# Patient Record
Sex: Female | Born: 1965 | Race: White | Hispanic: No | Smoking: Never smoker
Health system: Southern US, Community
[De-identification: ages and names within clinical notes are randomized; demographics above are authoritative.]

## PROBLEM LIST (undated history)

## (undated) DIAGNOSIS — K219 Gastro-esophageal reflux disease without esophagitis: Secondary | ICD-10-CM

## (undated) DIAGNOSIS — F419 Anxiety disorder, unspecified: Secondary | ICD-10-CM

## (undated) DIAGNOSIS — M549 Dorsalgia, unspecified: Secondary | ICD-10-CM

## (undated) DIAGNOSIS — F32A Depression, unspecified: Secondary | ICD-10-CM

## (undated) DIAGNOSIS — I10 Essential (primary) hypertension: Secondary | ICD-10-CM

## (undated) DIAGNOSIS — M797 Fibromyalgia: Secondary | ICD-10-CM

## (undated) DIAGNOSIS — G8929 Other chronic pain: Secondary | ICD-10-CM

## (undated) DIAGNOSIS — G2581 Restless legs syndrome: Secondary | ICD-10-CM

## (undated) DIAGNOSIS — F329 Major depressive disorder, single episode, unspecified: Secondary | ICD-10-CM

## (undated) HISTORY — PX: TUBAL LIGATION: SHX77

## (undated) HISTORY — PX: OTHER SURGICAL HISTORY: SHX169

---

## 2006-11-25 ENCOUNTER — Emergency Department (HOSPITAL_COMMUNITY): Admission: EM | Admit: 2006-11-25 | Discharge: 2006-11-25 | Payer: Self-pay | Admitting: Emergency Medicine

## 2006-11-30 ENCOUNTER — Ambulatory Visit (HOSPITAL_COMMUNITY): Admission: RE | Admit: 2006-11-30 | Discharge: 2006-11-30 | Payer: Self-pay | Admitting: Orthopaedic Surgery

## 2006-12-07 ENCOUNTER — Encounter (HOSPITAL_COMMUNITY): Admission: RE | Admit: 2006-12-07 | Discharge: 2007-01-06 | Payer: Self-pay | Admitting: Orthopaedic Surgery

## 2007-04-29 ENCOUNTER — Encounter (HOSPITAL_COMMUNITY): Admission: RE | Admit: 2007-04-29 | Discharge: 2007-05-04 | Payer: Self-pay | Admitting: Orthopaedic Surgery

## 2007-05-05 ENCOUNTER — Encounter (HOSPITAL_COMMUNITY): Admission: RE | Admit: 2007-05-05 | Discharge: 2007-06-04 | Payer: Self-pay | Admitting: Orthopaedic Surgery

## 2007-06-07 ENCOUNTER — Ambulatory Visit: Admission: RE | Admit: 2007-06-07 | Discharge: 2007-06-07 | Payer: Self-pay | Admitting: Orthopaedic Surgery

## 2007-11-23 ENCOUNTER — Encounter: Admission: RE | Admit: 2007-11-23 | Discharge: 2007-12-29 | Payer: Self-pay | Admitting: Orthopaedic Surgery

## 2008-03-27 ENCOUNTER — Emergency Department (HOSPITAL_COMMUNITY): Admission: EM | Admit: 2008-03-27 | Discharge: 2008-03-27 | Payer: Self-pay | Admitting: Emergency Medicine

## 2008-03-31 ENCOUNTER — Ambulatory Visit (HOSPITAL_COMMUNITY): Admission: RE | Admit: 2008-03-31 | Discharge: 2008-03-31 | Payer: Self-pay | Admitting: Obstetrics and Gynecology

## 2008-04-04 ENCOUNTER — Other Ambulatory Visit: Admission: RE | Admit: 2008-04-04 | Discharge: 2008-04-04 | Payer: Self-pay | Admitting: Unknown Physician Specialty

## 2008-04-04 ENCOUNTER — Encounter (INDEPENDENT_AMBULATORY_CARE_PROVIDER_SITE_OTHER): Payer: Self-pay | Admitting: Unknown Physician Specialty

## 2008-08-16 ENCOUNTER — Encounter: Payer: Self-pay | Admitting: Obstetrics & Gynecology

## 2008-08-16 ENCOUNTER — Ambulatory Visit (HOSPITAL_COMMUNITY): Admission: RE | Admit: 2008-08-16 | Discharge: 2008-08-16 | Payer: Self-pay | Admitting: Obstetrics & Gynecology

## 2009-01-16 ENCOUNTER — Encounter (HOSPITAL_COMMUNITY): Admission: RE | Admit: 2009-01-16 | Discharge: 2009-02-15 | Payer: Self-pay | Admitting: Orthopaedic Surgery

## 2009-02-06 ENCOUNTER — Ambulatory Visit (HOSPITAL_COMMUNITY): Admission: RE | Admit: 2009-02-06 | Discharge: 2009-02-06 | Payer: Self-pay | Admitting: Neurology

## 2009-04-17 ENCOUNTER — Encounter: Admission: RE | Admit: 2009-04-17 | Discharge: 2009-04-17 | Payer: Self-pay | Admitting: Gastroenterology

## 2009-09-11 ENCOUNTER — Other Ambulatory Visit: Admission: RE | Admit: 2009-09-11 | Discharge: 2009-09-11 | Payer: Self-pay | Admitting: Obstetrics & Gynecology

## 2009-11-09 ENCOUNTER — Ambulatory Visit (HOSPITAL_COMMUNITY)
Admission: RE | Admit: 2009-11-09 | Discharge: 2009-11-09 | Payer: Self-pay | Admitting: Physical Medicine and Rehabilitation

## 2009-12-18 ENCOUNTER — Ambulatory Visit (HOSPITAL_COMMUNITY): Admission: RE | Admit: 2009-12-18 | Discharge: 2009-12-18 | Payer: Self-pay | Admitting: Neurology

## 2010-01-11 ENCOUNTER — Ambulatory Visit (HOSPITAL_COMMUNITY): Admission: RE | Admit: 2010-01-11 | Discharge: 2010-01-11 | Payer: Self-pay | Admitting: Neurology

## 2010-09-17 ENCOUNTER — Other Ambulatory Visit: Payer: Self-pay | Admitting: Obstetrics & Gynecology

## 2010-09-17 ENCOUNTER — Other Ambulatory Visit (HOSPITAL_COMMUNITY)
Admission: RE | Admit: 2010-09-17 | Discharge: 2010-09-17 | Disposition: A | Discharge status: Home / Self Care | Payer: Medicare Other | Source: Ambulatory Visit | Attending: Obstetrics & Gynecology | Admitting: Obstetrics & Gynecology

## 2010-09-17 DIAGNOSIS — Z01419 Encounter for gynecological examination (general) (routine) without abnormal findings: Secondary | ICD-10-CM | POA: Insufficient documentation

## 2010-10-23 LAB — CREATININE, SERUM
Creatinine, Ser: 0.62 mg/dL (ref 0.4–1.2)
GFR calc non Af Amer: >60 mL/min (ref >60)

## 2010-11-18 LAB — CBC
HCT: 43.5 % (ref 36.0–46.0)
Platelets: 283 10*3/uL (ref 150–400)
RBC: 4.61 MIL/uL (ref 3.87–5.11)
RDW: 13.8 % (ref 11.5–15.5)
WBC: 7.7 10*3/uL (ref 4.0–10.5)

## 2010-11-18 LAB — COMPREHENSIVE METABOLIC PANEL
AST: 18 U/L (ref 0–37)
Alkaline Phosphatase: 73 U/L (ref 39–117)
CO2: 26 mEq/L (ref 19–32)
Creatinine, Ser: 0.63 mg/dL (ref 0.4–1.2)
Glucose, Bld: 90 mg/dL (ref 70–99)
Total Bilirubin: 0.8 mg/dL (ref 0.3–1.2)
Total Protein: 6.6 g/dL (ref 6.0–8.3)

## 2010-11-18 LAB — URINALYSIS, ROUTINE W REFLEX MICROSCOPIC
Protein, ur: NEGATIVE mg/dL
pH: 7.5 (ref 5.0–8.0)

## 2010-11-18 LAB — HCG, QUANTITATIVE, PREGNANCY: hCG, Beta Chain, Quant, S: <2 m[IU]/mL (ref <5)

## 2010-12-17 NOTE — Op Note (Signed)
Ruth Rose, Ruth Rose                  ACCOUNT NO.:  [id number]   MEDICAL RECORD NO.:  [id number]          PATIENT TYPE:  AMB   LOCATION:  DAY                           FACILITY:  [facility]   PHYSICIAN:  Lazaro Arms, M.D.   DATE OF BIRTH:  [birth year]   DATE OF PROCEDURE:  08/16/2008  DATE OF DISCHARGE:                               OPERATIVE REPORT   PREOPERATIVE DIAGNOSES:  1. Menometrorrhagia.  2. Dysmenorrhea.   POSTOPERATIVE DIAGNOSES:  1. Menometrorrhagia.  2. Dysmenorrhea.   PROCEDURES:  1. Hysteroscopy.  2. Dilation and curettage.  3. Endometrial ablation.   SURGEON:  Lazaro Arms, MD   ANESTHESIA:  Laryngeal mask airway, general.   FINDINGS:  The patient had normal endometrial cavity, no polyps, no  fibroids, no unusual-appearing endometrium.   DESCRIPTION OF OPERATION:  The patient was taken to the operating room,  placed in supine position, underwent laryngeal mask airway.  Placed in  lithotomy position, prepped and draped in usual sterile fashion.  Graves  speculum was placed.  Cervix was grasped with a single-tooth tenaculum.  The cervix was dilated serially to allow passage of the hysteroscope.  Hysteroscopy was performed and the above-noted findings were seen.  ThermaChoice III endometrial ablation balloon was used, 57 mL of D5W was  required to maintain a pressure greater than 150 mmHg throughout the  procedure.  She had a very spongy uterus, was not that large of cavity,  but the uterus had a lot of give to it, but it also required 3 heating  cycles, so total therapy time was 19 minutes and 50 seconds.  The  patient tolerated the procedure well.  All of the fluid was recovered at  the end of the procedure.  All the equipment worked properly.  She did  have bleeding in the area of single-tooth tenaculum site and single  figure-  of-eight suture was placed there with good hemostasis.  The patient was  awakened from anesthesia, taken to recovery room in good and  stable  condition.  All counts were correct.  She did undergo endometrial  curettage as well, and those curettings were sent to pathology for  evaluation.      Lazaro Arms, M.D.  Electronically Signed     LHE/MEDQ  D:  08/16/2008  T:  08/17/2008  Job:  [id number]

## 2011-09-22 ENCOUNTER — Other Ambulatory Visit: Payer: Self-pay | Admitting: Obstetrics & Gynecology

## 2011-09-22 ENCOUNTER — Other Ambulatory Visit (HOSPITAL_COMMUNITY)
Admission: RE | Admit: 2011-09-22 | Discharge: 2011-09-22 | Disposition: A | Discharge status: Home / Self Care | Payer: Medicare Other | Source: Ambulatory Visit | Attending: Obstetrics & Gynecology | Admitting: Obstetrics & Gynecology

## 2011-09-22 DIAGNOSIS — Z124 Encounter for screening for malignant neoplasm of cervix: Secondary | ICD-10-CM | POA: Insufficient documentation

## 2012-01-13 ENCOUNTER — Other Ambulatory Visit: Payer: Self-pay | Admitting: Medical

## 2012-03-19 ENCOUNTER — Other Ambulatory Visit: Payer: Self-pay | Admitting: Medical

## 2012-03-27 ENCOUNTER — Encounter (HOSPITAL_COMMUNITY): Payer: Self-pay

## 2012-03-27 ENCOUNTER — Emergency Department (HOSPITAL_COMMUNITY)
Admission: EM | Admit: 2012-03-27 | Discharge: 2012-03-27 | Disposition: A | Discharge status: Home / Self Care | Payer: Medicare Other | Attending: Emergency Medicine | Admitting: Emergency Medicine

## 2012-03-27 DIAGNOSIS — G2581 Restless legs syndrome: Secondary | ICD-10-CM | POA: Insufficient documentation

## 2012-03-27 DIAGNOSIS — I10 Essential (primary) hypertension: Secondary | ICD-10-CM | POA: Insufficient documentation

## 2012-03-27 DIAGNOSIS — F329 Major depressive disorder, single episode, unspecified: Secondary | ICD-10-CM | POA: Insufficient documentation

## 2012-03-27 DIAGNOSIS — S39012A Strain of muscle, fascia and tendon of lower back, initial encounter: Secondary | ICD-10-CM

## 2012-03-27 DIAGNOSIS — F3289 Other specified depressive episodes: Secondary | ICD-10-CM | POA: Insufficient documentation

## 2012-03-27 DIAGNOSIS — X500XXA Overexertion from strenuous movement or load, initial encounter: Secondary | ICD-10-CM | POA: Insufficient documentation

## 2012-03-27 DIAGNOSIS — F411 Generalized anxiety disorder: Secondary | ICD-10-CM | POA: Insufficient documentation

## 2012-03-27 DIAGNOSIS — K219 Gastro-esophageal reflux disease without esophagitis: Secondary | ICD-10-CM | POA: Insufficient documentation

## 2012-03-27 DIAGNOSIS — S335XXA Sprain of ligaments of lumbar spine, initial encounter: Secondary | ICD-10-CM | POA: Insufficient documentation

## 2012-03-27 HISTORY — DX: Major depressive disorder, single episode, unspecified: F32.9

## 2012-03-27 HISTORY — DX: Depression, unspecified: F32.A

## 2012-03-27 HISTORY — DX: Essential (primary) hypertension: I10

## 2012-03-27 HISTORY — DX: Dorsalgia, unspecified: M54.9

## 2012-03-27 HISTORY — DX: Gastro-esophageal reflux disease without esophagitis: K21.9

## 2012-03-27 HISTORY — DX: Anxiety disorder, unspecified: F41.9

## 2012-03-27 HISTORY — DX: Other chronic pain: G89.29

## 2012-03-27 HISTORY — DX: Restless legs syndrome: G25.81

## 2012-03-27 MED ORDER — CYCLOBENZAPRINE HCL 10 MG PO TABS
10.0000 mg | ORAL_TABLET | Freq: Once | ORAL | Status: AC
  Administered 2012-03-27: 10 mg via ORAL
  Filled 2012-03-27: qty 1

## 2012-03-27 MED ORDER — ONDANSETRON 4 MG PO TBDP
4.0000 mg | ORAL_TABLET | Freq: Once | ORAL | Status: AC
  Administered 2012-03-27: 4 mg via ORAL
  Filled 2012-03-27: qty 1

## 2012-03-27 MED ORDER — PREDNISONE 50 MG PO TABS: ORAL_TABLET | ORAL | Status: AC

## 2012-03-27 MED ORDER — PREDNISONE 20 MG PO TABS
60.0000 mg | ORAL_TABLET | Freq: Once | ORAL | Status: AC
  Administered 2012-03-27: 60 mg via ORAL
  Filled 2012-03-27: qty 3

## 2012-03-27 MED ORDER — HYDROMORPHONE HCL PF 1 MG/ML IJ SOLN
1.0000 mg | Freq: Once | INTRAMUSCULAR | Status: AC
  Administered 2012-03-27: 1 mg via INTRAMUSCULAR
  Filled 2012-03-27: qty 1

## 2012-03-27 NOTE — ED Notes (Signed)
Pt reports a leg cramp that ran up her leg this morning and now she is having back pain, pt denies any recent injury, has h/o chronic back pain

## 2012-03-27 NOTE — ED Notes (Signed)
Pt states awoke this morning with left leg pain/cramps, after rolling over in bed. Now describes pain in lower back. Pt has H/O of chronic back pain. Reports radiofrequency nerve block completed at pain management clinic.  Pt sitting in wheel chair, NAD noted.

## 2012-03-27 NOTE — ED Provider Notes (Signed)
History     CSN: [id number]  Arrival date & time 03/27/12  1336   None     Chief Complaint  Patient presents with  . Back Pain    (Consider location/radiation/quality/duration/timing/severity/associated sxs/prior treatment) HPI Comments: Pt had a cramp in her R calf this AM.  She twisted to get out of bed this AM and began having L lower lumbar pain.  She has chronic back pain and is seen by dr. Gerilyn Pilgrim for pain management.    Has taken her hydrocodone with no relief.  Patient is a 46 y.o. female presenting with back pain. The history is provided by the patient. No language interpreter was used.  Back Pain  This is a chronic problem. Episode onset: this AM. The problem occurs constantly. The problem has not changed since onset.The pain is associated with twisting. The pain is present in the lumbar spine. The pain does not radiate. The pain is severe. The symptoms are aggravated by bending and twisting. Pertinent negatives include no numbness, no bowel incontinence, no perianal numbness, no bladder incontinence and no weakness. She has tried analgesics for the symptoms.    Past Medical History  Diagnosis Date  . Chronic back pain   . Hypertension   . Depression   . Anxiety   . Acid reflux   . Restless leg syndrome     Past Surgical History  Procedure Date  . Tubal ligation   . Ablasion     No family history on file.  History  Substance Use Topics  . Smoking status: Never Smoker   . Smokeless tobacco: Not on file  . Alcohol Use: No    OB History    Grav Para Term Preterm Abortions TAB SAB Ect Mult Living                  Review of Systems  Gastrointestinal: Negative for bowel incontinence.  Genitourinary: Negative for bladder incontinence.  Musculoskeletal: Positive for back pain.  Neurological: Negative for weakness and numbness.  All other systems reviewed and are negative.    Allergies  Review of patient's allergies indicates no known  allergies.  Home Medications   Current Outpatient Rx  Name Route Sig Dispense Refill  . PREDNISONE 50 MG PO TABS  One tab po QD 5 tablet 0    BP 117/73  Pulse 86  Temp 98 F (36.7 C) (Oral)  Resp 20  Ht 5\' 2"  (1.575 m)  Wt 118 lb (53.524 kg)  BMI 21.58 kg/m2  SpO2 100%  Physical Exam  Nursing note and vitals reviewed. Constitutional: She is oriented to person, place, and time. She appears well-developed and well-nourished. No distress.  HENT:  Head: Normocephalic and atraumatic.  Eyes: EOM are normal.  Neck: Normal range of motion.  Cardiovascular: Normal rate, regular rhythm and normal heart sounds.   Pulmonary/Chest: Effort normal and breath sounds normal.  Abdominal: Soft. She exhibits no distension. There is no tenderness.  Musculoskeletal: She exhibits tenderness.       Lumbar back: She exhibits decreased range of motion, tenderness and pain. She exhibits no bony tenderness, no swelling, no edema, no deformity, no laceration, no spasm and normal pulse.       Back:  Neurological: She is alert and oriented to person, place, and time.  Skin: Skin is warm and dry.  Psychiatric: She has a normal mood and affect. Judgment normal.    ED Course  Procedures (including critical care time)  Labs Reviewed - No  data to display No results found.   1. Lumbar strain       MDM  Has hydrocodone rx-prednisone 50 mg, 6 Ice F/u with dr. Gerilyn Pilgrim.        Evalina Field, [place] 03/27/12 1512

## 2012-03-27 NOTE — ED Provider Notes (Signed)
Medical screening examination/treatment/procedure(s) were performed by non-physician practitioner and as supervising physician I was immediately available for consultation/collaboration.  Hurman Horn, MD 03/27/12 Ebony Cargo

## 2012-04-07 ENCOUNTER — Other Ambulatory Visit: Payer: Self-pay | Admitting: Medical

## 2012-05-04 ENCOUNTER — Emergency Department (HOSPITAL_COMMUNITY): Payer: Medicare Other

## 2012-05-04 ENCOUNTER — Encounter (HOSPITAL_COMMUNITY): Payer: Self-pay

## 2012-05-04 ENCOUNTER — Emergency Department (HOSPITAL_COMMUNITY)
Admission: EM | Admit: 2012-05-04 | Discharge: 2012-05-04 | Disposition: A | Discharge status: Home / Self Care | Payer: Medicare Other | Attending: Emergency Medicine | Admitting: Emergency Medicine

## 2012-05-04 DIAGNOSIS — G8929 Other chronic pain: Secondary | ICD-10-CM | POA: Insufficient documentation

## 2012-05-04 DIAGNOSIS — G2581 Restless legs syndrome: Secondary | ICD-10-CM | POA: Insufficient documentation

## 2012-05-04 DIAGNOSIS — R079 Chest pain, unspecified: Secondary | ICD-10-CM

## 2012-05-04 DIAGNOSIS — F411 Generalized anxiety disorder: Secondary | ICD-10-CM | POA: Insufficient documentation

## 2012-05-04 DIAGNOSIS — R071 Chest pain on breathing: Secondary | ICD-10-CM | POA: Insufficient documentation

## 2012-05-04 DIAGNOSIS — I10 Essential (primary) hypertension: Secondary | ICD-10-CM | POA: Insufficient documentation

## 2012-05-04 LAB — BASIC METABOLIC PANEL
Calcium: 8.8 mg/dL (ref 8.4–10.5)
Creatinine, Ser: 0.86 mg/dL (ref 0.50–1.10)
GFR calc Af Amer: >90 mL/min (ref >90)

## 2012-05-04 LAB — CBC WITH DIFFERENTIAL/PLATELET
Eosinophils Absolute: 0.1 10*3/uL (ref 0.0–0.7)
HCT: 38.9 % (ref 36.0–46.0)
MCHC: 32.6 g/dL (ref 30.0–36.0)
MCV: 97 fL (ref 78.0–100.0)
Monocytes Relative: 9 % (ref 3–12)
Neutro Abs: 7.4 10*3/uL (ref 1.7–7.7)
RDW: 13.7 % (ref 11.5–15.5)

## 2012-05-04 MED ORDER — KETOROLAC TROMETHAMINE 60 MG/2ML IM SOLN
60.0000 mg | Freq: Once | INTRAMUSCULAR | Status: AC
Start: 2012-05-04 — End: 2012-05-04
  Administered 2012-05-04: 60 mg via INTRAMUSCULAR
  Filled 2012-05-04: qty 2

## 2012-05-04 MED ORDER — CYCLOBENZAPRINE HCL 10 MG PO TABS: 10.0000 mg | ORAL_TABLET | Freq: Two times a day (BID) | ORAL | Status: DC | PRN

## 2012-05-04 MED ORDER — OXYCODONE-ACETAMINOPHEN 5-325 MG PO TABS: 1.0000 | ORAL_TABLET | Freq: Four times a day (QID) | ORAL | Status: DC | PRN

## 2012-05-04 NOTE — ED Provider Notes (Signed)
History  This chart was scribed for Donnetta Hutching, MD by Ladona Ridgel Day. This patient was seen in room [facility] and the patient's care was started at 1204.   CSN: [id number]  Arrival date & time 05/04/12  1204   First MD Initiated Contact with Patient 05/04/12 1304      Chief Complaint  Patient presents with  . Chest Pain   Patient is a 46 y.o. female presenting with chest pain. The history is provided by the patient. No language interpreter was used.  Chest Pain The chest pain began 2 days ago. Duration of episode(s) is 15 minutes. Chest pain occurs intermittently. The chest pain is unchanged. The pain is associated with lifting. The severity of the pain is moderate. The quality of the pain is described as sharp. The pain does not radiate. Chest pain is worsened by certain positions. Pertinent negatives for primary symptoms include no fever, no shortness of breath, no nausea and no vomiting.  Pertinent negatives for associated symptoms include no weakness. She tried nothing for the symptoms.    Ruth Rose is a 46 y.o. female who presents to the Emergency Department complaining of intermittent chest tightness for the past 3-4 days worse with left shoulder extension and lasts 15-20 minutse at a time for the past 2 days. She denies any fever, emesis, diarrhea, cough/congestion.   Her PCP is Dr. Cyndee Brightly in [facility]  Past Medical History  Diagnosis Date  . Chronic back pain   . Hypertension   . Depression   . Anxiety   . Acid reflux   . Restless leg syndrome     Past Surgical History  Procedure Date  . Tubal ligation   . Ablasion     No family history on file.  History  Substance Use Topics  . Smoking status: Never Smoker   . Smokeless tobacco: Not on file  . Alcohol Use: No    OB History    Grav Para Term Preterm Abortions TAB SAB Ect Mult Living                  Review of Systems  Constitutional: Negative for fever and chills.  Respiratory: Negative for shortness of  breath.   Cardiovascular: Positive for chest pain.  Gastrointestinal: Negative for nausea and vomiting.  Musculoskeletal:       Pain with ROM left shoulder  Neurological: Negative for weakness.  All other systems reviewed and are negative.    Allergies  Review of patient's allergies indicates no known allergies.  Home Medications   Current Outpatient Rx  Name Route Sig Dispense Refill  . ALPRAZOLAM 1 MG PO TABS Oral Take 1 mg by mouth 3 (three) times daily.    Marland Kitchen DICLOFENAC EPOLAMINE 1.3 % TD PTCH Transdermal Place 1 patch onto the skin daily.    Marland Kitchen DICLOFENAC SODIUM 75 MG PO TBEC Oral Take 75 mg by mouth 2 (two) times daily.    Marland Kitchen ESOMEPRAZOLE MAGNESIUM 40 MG PO CPDR Oral Take 40 mg by mouth daily before breakfast.    . FLUTICASONE PROPIONATE 50 MCG/ACT NA SUSP Nasal Place 2 sprays into the nose daily.    Marland Kitchen GABAPENTIN (PHN) 600 MG PO TABS Oral Take 600 mg by mouth 3 (three) times daily.    Marland Kitchen ONDANSETRON HCL 4 MG PO TABS Oral Take 4 mg by mouth every 8 (eight) hours as needed. Nausea and Vomiting    . ROPINIROLE HCL 0.25 MG PO TABS Oral Take 0.25 mg by mouth  daily.    . SUMATRIPTAN SUCCINATE 100 MG PO TABS Oral Take 100 mg by mouth every 2 (two) hours as needed. Migraine    . TOPIRAMATE 50 MG PO TABS Oral Take 50 mg by mouth daily.      Triage vitals: BP 117/79  Pulse 87  Temp 98.3 F (36.8 C)  Resp 20  Ht 5\' 2"  (1.575 m)  Wt 140 lb (63.504 kg)  BMI 25.61 kg/m2  SpO2 100%  Physical Exam  Nursing note and vitals reviewed. Constitutional: She is oriented to person, place, and time. She appears well-developed and well-nourished.  HENT:  Head: Normocephalic and atraumatic.  Eyes: Conjunctivae normal and EOM are normal. Pupils are equal, round, and reactive to light.  Neck: Normal range of motion. Neck supple.  Cardiovascular: Normal rate, regular rhythm and normal heart sounds.   Pulmonary/Chest: Effort normal and breath sounds normal. No respiratory distress. She has no  wheezes. She has no rales. She exhibits tenderness (Left anterior chest wall tenderness          ).  Abdominal: Soft. Bowel sounds are normal.  Musculoskeletal: Normal range of motion.  Neurological: She is alert and oriented to person, place, and time.  Skin: Skin is warm and dry.  Psychiatric: She has a normal mood and affect.    ED Course  Procedures (including critical care time) DIAGNOSTIC STUDIES: Oxygen Saturation is 100% on room air, normal by my interpretation.    COORDINATION OF CARE: At 145 PM Discussed treatment plan with patient which includes blood work, heart markers, D-dimer, CXR. Patient agrees.   Labs Reviewed  CBC WITH DIFFERENTIAL - Abnormal; Notable for the following:    WBC 11.8 (*)     Monocytes Absolute 1.1 (*)     All other components within normal limits  BASIC METABOLIC PANEL - Abnormal; Notable for the following:    GFR calc non Af Amer 80 (*)     All other components within normal limits  D-DIMER, QUANTITATIVE - Abnormal; Notable for the following:    D-Dimer, Quant 1.65 (*)     All other components within normal limits  TROPONIN I   Dg Chest 2 View  05/04/2012  *RADIOLOGY REPORT*  Clinical Data: Chest pain  CHEST - 2 VIEW  Comparison: None.  Findings: Lungs clear.  Heart size and pulmonary vascularity are normal.  No adenopathy.  There is lower thoracic dextroscoliosis with lumbar levoscoliosis.  IMPRESSION: No edema or consolidation.   Original Report Authenticated By: Arvin Collard. WOODRUFF III, M.D.      No diagnosis found.   Date: 05/04/2012  Rate: 90  Rhythm: normal sinus rhythm  QRS Axis: normal  Intervals: normal  ST/T Wave abnormalities: normal  Conduction Disutrbances: none  Narrative Interpretation: unremarkable     MDM   Patient low risk for acute coronary syndrome or pulmonary embolus. Pain is clearly worse with movement of the arm. She is also tender in the chest wall. D-dimer which was not ordered by me was elevated. This is  noted, but again her risk factor profile for PE is extremely low. Normal vital signs. Normal oxygenation.discharge home on Percocet and Flexeril     I personally performed the services described in this documentation, which was scribed in my presence. The recorded information has been reviewed and considered.          Donnetta Hutching, MD 05/04/12 [phone]

## 2012-05-04 NOTE — ED Notes (Signed)
Pt reports left side/mid sternal cp x2 days pain comes and goes, sob at times, denies any n/v/d or fever. Denies any cough or congestion. Pain worse with left arm movement.

## 2012-05-04 NOTE — ED Notes (Signed)
Pt with left chest pain x 2-3 days per pt, today with SOB, no distress noted at this time, denies N/V or diaphoresis

## 2012-06-28 ENCOUNTER — Ambulatory Visit (HOSPITAL_COMMUNITY)
Admission: RE | Admit: 2012-06-28 | Discharge: 2012-06-28 | Disposition: A | Discharge status: Home / Self Care | Payer: Medicare Other | Source: Ambulatory Visit | Attending: Nurse Practitioner | Admitting: Nurse Practitioner

## 2012-06-28 ENCOUNTER — Other Ambulatory Visit (HOSPITAL_COMMUNITY): Payer: Self-pay | Admitting: Nurse Practitioner

## 2012-06-28 DIAGNOSIS — M545 Low back pain, unspecified: Secondary | ICD-10-CM | POA: Insufficient documentation

## 2012-06-28 DIAGNOSIS — M4120 Other idiopathic scoliosis, site unspecified: Secondary | ICD-10-CM | POA: Insufficient documentation

## 2012-08-06 ENCOUNTER — Emergency Department (HOSPITAL_COMMUNITY)
Admission: EM | Admit: 2012-08-06 | Discharge: 2012-08-06 | Disposition: A | Discharge status: Home / Self Care | Payer: Medicare Other | Attending: Emergency Medicine | Admitting: Emergency Medicine

## 2012-08-06 ENCOUNTER — Encounter (HOSPITAL_COMMUNITY): Payer: Self-pay | Admitting: *Deleted

## 2012-08-06 ENCOUNTER — Emergency Department (HOSPITAL_COMMUNITY): Payer: Medicare Other

## 2012-08-06 DIAGNOSIS — S60229A Contusion of unspecified hand, initial encounter: Secondary | ICD-10-CM | POA: Insufficient documentation

## 2012-08-06 DIAGNOSIS — Z8719 Personal history of other diseases of the digestive system: Secondary | ICD-10-CM | POA: Insufficient documentation

## 2012-08-06 DIAGNOSIS — M549 Dorsalgia, unspecified: Secondary | ICD-10-CM | POA: Insufficient documentation

## 2012-08-06 DIAGNOSIS — Z79899 Other long term (current) drug therapy: Secondary | ICD-10-CM | POA: Insufficient documentation

## 2012-08-06 DIAGNOSIS — Z8739 Personal history of other diseases of the musculoskeletal system and connective tissue: Secondary | ICD-10-CM | POA: Insufficient documentation

## 2012-08-06 DIAGNOSIS — G8929 Other chronic pain: Secondary | ICD-10-CM | POA: Insufficient documentation

## 2012-08-06 DIAGNOSIS — I10 Essential (primary) hypertension: Secondary | ICD-10-CM | POA: Insufficient documentation

## 2012-08-06 DIAGNOSIS — F3289 Other specified depressive episodes: Secondary | ICD-10-CM | POA: Insufficient documentation

## 2012-08-06 DIAGNOSIS — F329 Major depressive disorder, single episode, unspecified: Secondary | ICD-10-CM | POA: Insufficient documentation

## 2012-08-06 DIAGNOSIS — X58XXXA Exposure to other specified factors, initial encounter: Secondary | ICD-10-CM | POA: Insufficient documentation

## 2012-08-06 DIAGNOSIS — Y929 Unspecified place or not applicable: Secondary | ICD-10-CM | POA: Insufficient documentation

## 2012-08-06 DIAGNOSIS — F411 Generalized anxiety disorder: Secondary | ICD-10-CM | POA: Insufficient documentation

## 2012-08-06 DIAGNOSIS — Y9389 Activity, other specified: Secondary | ICD-10-CM | POA: Insufficient documentation

## 2012-08-06 MED ORDER — IBUPROFEN 800 MG PO TABS
800.0000 mg | ORAL_TABLET | Freq: Once | ORAL | Status: AC
  Administered 2012-08-06: 800 mg via ORAL
  Filled 2012-08-06: qty 1

## 2012-08-06 MED ORDER — IBUPROFEN 800 MG PO TABS: 800.0000 mg | ORAL_TABLET | Freq: Three times a day (TID) | ORAL | Status: DC

## 2012-08-06 NOTE — ED Notes (Signed)
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

## 2012-08-06 NOTE — ED Notes (Signed)
Pt c/o left hand pain since trying to open car door tonight.

## 2012-08-06 NOTE — ED Provider Notes (Signed)
History   This chart was scribed for Ruth Octave, MD by Ruth Rose, ED Scribe. This patient was seen in room [facility]03/[facility]03 and the patient's care was started at 9:41 PM    CSN: [id number]  Arrival date & time 08/06/12  2123   First MD Initiated Contact with Patient 08/06/12 2138      Chief Complaint  Patient presents with  . Hand Pain     The history is provided by the patient. No language interpreter was used.   Ruth Rose is a 47 y.o. female with h/o chronic back pain, HTN, depression, and anxiety who presents to the Emergency Department complaining of constant, mild, gradually worsening, non-radiating left hand pain with sudden onset after trying to yank car door open earlier today.  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  Pt denies pain in left wrist and elbow, and has no further physical complaints.  Pt denies tobacco and alcohol use.   Past Medical History  Diagnosis Date  . Chronic back pain   . Hypertension   . Depression   . Anxiety   . Acid reflux   . Restless leg syndrome     Past Surgical History  Procedure Date  . Tubal ligation   . Ablasion     History reviewed. No pertinent family history.  History  Substance Use Topics  . Smoking status: Never Smoker   . Smokeless tobacco: Not on file  . Alcohol Use: No    No OB history provided.   Review of Systems A complete 10 system review of systems was obtained and all systems are negative except as noted in the HPI and PMH.   Allergies  Review of patient's allergies indicates no known allergies.  Home Medications   Current Outpatient Rx  Name  Route  Sig  Dispense  Refill  . ALPRAZOLAM 1 MG PO TABS   Oral   Take 1 mg by mouth 3 (three) times daily.         . CYCLOBENZAPRINE HCL 10 MG PO TABS   Oral   Take 1 tablet (10 mg total) by mouth 2 (two) times daily as needed for muscle spasms.   20 tablet   0   .  DICLOFENAC EPOLAMINE 1.3 % TD PTCH   Transdermal   Place 1 patch onto the skin daily.         Marland Kitchen DICLOFENAC SODIUM 75 MG PO TBEC   Oral   Take 75 mg by mouth 2 (two) times daily.         Marland Kitchen FLUTICASONE PROPIONATE 50 MCG/ACT NA SUSP   Nasal   Place 2 sprays into the nose daily.         Marland Kitchen GABAPENTIN (PHN) 600 MG PO TABS   Oral   Take 600 mg by mouth 3 (three) times daily.         Marland Kitchen ONDANSETRON HCL 4 MG PO TABS   Oral   Take 4 mg by mouth every 8 (eight) hours as needed. Nausea and Vomiting         . ROPINIROLE HCL 0.25 MG PO TABS   Oral   Take 0.25 mg by mouth daily.         . TOPIRAMATE 50 MG PO TABS   Oral   Take 50 mg by mouth daily.         . TRAVOPROST (BAK FREE) 0.004 %  OP SOLN   Both Eyes   Place 1 drop into both eyes at bedtime.           BP 149/93  Pulse 87  Temp 97.5 F (36.4 C) (Oral)  Resp 18  Ht 5\' 2"  (1.575 m)  Wt 140 lb (63.504 kg)  BMI 25.61 kg/m2  SpO2 100%  Physical Exam  Nursing note and vitals reviewed. Constitutional: She is oriented to person, place, and time. She appears well-developed and well-nourished. No distress.  HENT:  Head: Normocephalic and atraumatic.  Eyes: Conjunctivae normal are normal.  Neck: Neck supple. No tracheal deviation present.  Cardiovascular: Normal rate, regular rhythm and normal heart sounds.   No murmur heard. Pulmonary/Chest: Effort normal and breath sounds normal. No respiratory distress. She has no wheezes.  Musculoskeletal: Normal range of motion.       Tender over left dorsal second metacarpal, +2 radial pulse, congenital deformities of fingers 2-4 with no flexor or extension activity at baseline, full ROM of thumb, no pain in left wrist and elbow.  Neurological: She is alert and oriented to person, place, and time.  Skin: Skin is warm and dry.  Psychiatric: She has a normal mood and affect. Her behavior is normal.    ED Course  Procedures (including critical care time) DIAGNOSTIC  STUDIES: Oxygen Saturation is 100% on room air, normal by my interpretation.    COORDINATION OF CARE: 9:45 PM- Patient informed of clinical course, understands medical decision-making process, and agrees with plan.  Ordered ibuprofen and left hand XR. 10:37 PM- Re-check, informed pt of negative XR.  Advised elevation and ice at home.  Discussed discharge.    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 Bones appear demineralized. Deformities of these third and fourth metacarpals. No definite acute fracture, dislocation or bone destruction.  IMPRESSION: No definite acute bony abnormalities.   Original Report Authenticated By: Ruth Rose, M.D.      No diagnosis found.    MDM  Pain to left second metacarpal after trying to open a frozen car door. Denies direct trauma denies weakness, numbness or tingling.  Congenital deformity to hand. +2 radial pulse.  No flexion or extension of fingers stubs at baseline.  Xray negative.  Supportive care with [facility].  I personally performed the services described in this documentation, which was scribed in my presence. The recorded information has been reviewed and is accurate.        Ruth Octave, MD 08/06/12 2248

## 2012-08-19 ENCOUNTER — Ambulatory Visit (HOSPITAL_COMMUNITY)
Admission: RE | Admit: 2012-08-19 | Discharge: 2012-08-19 | Disposition: A | Discharge status: Home / Self Care | Payer: Medicare Other | Source: Ambulatory Visit | Attending: Neurology | Admitting: Neurology

## 2012-08-19 DIAGNOSIS — M545 Low back pain, unspecified: Secondary | ICD-10-CM | POA: Insufficient documentation

## 2012-08-19 DIAGNOSIS — IMO0001 Reserved for inherently not codable concepts without codable children: Secondary | ICD-10-CM | POA: Insufficient documentation

## 2012-08-19 DIAGNOSIS — M6281 Muscle weakness (generalized): Secondary | ICD-10-CM | POA: Insufficient documentation
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
## 2012-08-20 DIAGNOSIS — M545 Low back pain: Secondary | ICD-10-CM | POA: Insufficient documentation

## 2012-08-23 ENCOUNTER — Ambulatory Visit (HOSPITAL_COMMUNITY)
Admission: RE | Admit: 2012-08-23 | Discharge: 2012-08-23 | Disposition: A | Discharge status: Home / Self Care | Payer: Medicare Other | Source: Ambulatory Visit | Attending: Family Medicine | Admitting: Family Medicine

## 2012-08-23 NOTE — Progress Notes (Signed)
Physical Therapy Treatment Patient Details  Name: Ruth Rose MRN: [id number] Date of Birth: [birth year]  Today's Date: 08/23/2012 Time: 9147-8295 PT Time Calculation (min): 57 min Charges: 30' manual, 10' TE, 1 estim, 1 heat  Visit#: 2  of 12   Re-eval: 09/18/12    Authorization: MEDICARE  Authorization Time Period:    Authorization Visit#: 2  of 10    Subjective: Symptoms/Limitations Symptoms: Pt reports that she was feeling a little better after the eval.  States she is very tight today.  Pain Assessment Currently in Pain?: Yes Pain Score:   7 Pain Location: Back  Exercise/Treatments Stretches Single Knee to Chest Stretch: 3 reps;30 seconds (BLE) Lower Trunk Rotation: 5 reps;10 seconds Supine Ab Set: 10 reps;Limitations AB Set Limitations: 10 sec hlds Bent Knee Raise: 5 reps (alternating BLE) Bridge: 10 reps Prone  Opposite Arm/Leg Raise: Left arm/Right leg;Right arm/Left leg;5 reps  Modalities Modalities: Electrical Stimulation;Moist Heat Manual Therapy Joint Mobilization: Grade I-II PA to SP and L T10-L3 w/STM to L errector spinae after to decrease pain.  Moist Heat Therapy Number Minutes Moist Heat: 15 Minutes Moist Heat Location: Other (comment) (back) Electrical Stimulation Electrical Stimulation Location: IFES to L thoracic and upper lumbar spine Electrical Stimulation Parameters: IFES x15 min.  Electrical Stimulation Goals: Pain  Physical Therapy Assessment and Plan PT Assessment and Plan Clinical Impression Statement: Pt with significant reduction in muscular spasms after manual treatment today.  Demonstrates greatest difficulty with LE flexibility and swelling to LUQ. Reports significant reduction in pain at end of treatment today.  PT Plan: F/U w/manual therapy and e-stim treatment and exercises.  Continue to progress core strength in supine, SL and prone postions.  Progress to standing and t-band when spasms and pain are decreased.     Goals     Problem List Patient Active Problem List  Diagnosis  . Lumbago    PT Plan of Care PT Patient Instructions: Continued to encourage patient about diaphragmatic breathing and focusing on positive aspects of her life.   Lyza Houseworth, PT 08/23/2012, 5:39 PM

## 2012-08-25 ENCOUNTER — Ambulatory Visit (HOSPITAL_COMMUNITY): Payer: Medicare Other | Admitting: Physical Therapy

## 2012-08-27 ENCOUNTER — Ambulatory Visit (HOSPITAL_COMMUNITY): Payer: Medicare Other | Admitting: Physical Therapy

## 2012-08-30 ENCOUNTER — Ambulatory Visit (HOSPITAL_COMMUNITY): Payer: Medicare Other | Admitting: Physical Therapy

## 2012-09-01 ENCOUNTER — Ambulatory Visit (HOSPITAL_COMMUNITY): Payer: Medicare Other | Admitting: Physical Therapy

## 2012-09-03 ENCOUNTER — Ambulatory Visit (HOSPITAL_COMMUNITY): Payer: Medicare Other | Admitting: Physical Therapy

## 2012-09-06 ENCOUNTER — Ambulatory Visit (HOSPITAL_COMMUNITY): Payer: Medicare Other | Admitting: Physical Therapy

## 2012-09-08 ENCOUNTER — Ambulatory Visit (HOSPITAL_COMMUNITY): Payer: Medicare Other

## 2012-09-10 ENCOUNTER — Ambulatory Visit (HOSPITAL_COMMUNITY): Payer: Medicare Other | Admitting: Physical Therapy

## 2012-09-13 ENCOUNTER — Ambulatory Visit (HOSPITAL_COMMUNITY): Payer: Medicare Other | Admitting: Physical Therapy

## 2012-09-15 ENCOUNTER — Ambulatory Visit (HOSPITAL_COMMUNITY): Payer: Medicare Other

## 2012-09-17 ENCOUNTER — Ambulatory Visit (HOSPITAL_COMMUNITY): Payer: Medicare Other | Admitting: Physical Therapy

## 2012-11-05 ENCOUNTER — Encounter (HOSPITAL_COMMUNITY): Payer: Self-pay | Admitting: *Deleted

## 2012-11-05 ENCOUNTER — Emergency Department (HOSPITAL_COMMUNITY)
Admission: EM | Admit: 2012-11-05 | Discharge: 2012-11-05 | Disposition: A | Discharge status: Home / Self Care | Payer: Medicare Other | Attending: Emergency Medicine | Admitting: Emergency Medicine

## 2012-11-05 DIAGNOSIS — IMO0001 Reserved for inherently not codable concepts without codable children: Secondary | ICD-10-CM | POA: Insufficient documentation

## 2012-11-05 DIAGNOSIS — I10 Essential (primary) hypertension: Secondary | ICD-10-CM | POA: Insufficient documentation

## 2012-11-05 DIAGNOSIS — F329 Major depressive disorder, single episode, unspecified: Secondary | ICD-10-CM | POA: Insufficient documentation

## 2012-11-05 DIAGNOSIS — G8929 Other chronic pain: Secondary | ICD-10-CM | POA: Insufficient documentation

## 2012-11-05 DIAGNOSIS — IMO0002 Reserved for concepts with insufficient information to code with codable children: Secondary | ICD-10-CM | POA: Insufficient documentation

## 2012-11-05 DIAGNOSIS — G2581 Restless legs syndrome: Secondary | ICD-10-CM | POA: Insufficient documentation

## 2012-11-05 DIAGNOSIS — F3289 Other specified depressive episodes: Secondary | ICD-10-CM | POA: Insufficient documentation

## 2012-11-05 DIAGNOSIS — Z791 Long term (current) use of non-steroidal anti-inflammatories (NSAID): Secondary | ICD-10-CM | POA: Insufficient documentation

## 2012-11-05 DIAGNOSIS — F411 Generalized anxiety disorder: Secondary | ICD-10-CM | POA: Insufficient documentation

## 2012-11-05 DIAGNOSIS — Z8739 Personal history of other diseases of the musculoskeletal system and connective tissue: Secondary | ICD-10-CM | POA: Insufficient documentation

## 2012-11-05 DIAGNOSIS — Z8719 Personal history of other diseases of the digestive system: Secondary | ICD-10-CM | POA: Insufficient documentation

## 2012-11-05 DIAGNOSIS — J029 Acute pharyngitis, unspecified: Secondary | ICD-10-CM | POA: Insufficient documentation

## 2012-11-05 DIAGNOSIS — Z79899 Other long term (current) drug therapy: Secondary | ICD-10-CM | POA: Insufficient documentation

## 2012-11-05 LAB — RAPID STREP SCREEN (MED CTR MEBANE ONLY): Streptococcus, Group A Screen (Direct): NEGATIVE

## 2012-11-05 MED ORDER — AMOXICILLIN 250 MG PO CAPS
500.0000 mg | ORAL_CAPSULE | Freq: Once | ORAL | Status: AC
  Administered 2012-11-05: 500 mg via ORAL

## 2012-11-05 MED ORDER — AMOXICILLIN 250 MG PO CAPS
ORAL_CAPSULE | ORAL | Status: AC
  Filled 2012-11-05: qty 2

## 2012-11-05 MED ORDER — AMOXICILLIN 500 MG PO CAPS: 500.0000 mg | ORAL_CAPSULE | Freq: Three times a day (TID) | ORAL | Status: DC

## 2012-11-05 NOTE — ED Provider Notes (Signed)
History    This chart was scribed for Ruth Lennert, MD by Gerlean Ren, ED Scribe. This patient was seen in room APFT23/APFT23 and the patient's care was started at 9:27 PM    CSN: [id number]  Arrival date & time 11/05/12  2108   First MD Initiated Contact with Patient 11/05/12 2126      Chief Complaint  Patient presents with  . Sore Throat     The history is provided by the patient. No language interpreter was used.  Ruth Rose is a 47 y.o. female who presents to the Emergency Department complaining of sore throat first noticed last night with associated myalgias beginning today.  Pt denies fevers, chills, nausea, emesis, diarrhea.  No sick contacts.   Past Medical History  Diagnosis Date  . Chronic back pain   . Hypertension   . Depression   . Anxiety   . Acid reflux   . Restless leg syndrome     Past Surgical History  Procedure Laterality Date  . Tubal ligation    . Ablasion      History reviewed. No pertinent family history.  History  Substance Use Topics  . Smoking status: Never Smoker   . Smokeless tobacco: Not on file  . Alcohol Use: No    No OB history provided.   Review of Systems  Constitutional: Negative for fever and chills.  HENT: Positive for sore throat. Negative for congestion, sinus pressure and ear discharge.   Eyes: Negative for discharge.  Respiratory: Negative for cough.   Cardiovascular: Negative for chest pain.  Gastrointestinal: Negative for nausea, vomiting, abdominal pain and diarrhea.  Genitourinary: Negative for frequency and hematuria.  Musculoskeletal: Positive for myalgias.  Skin: Negative for rash.  Neurological: Negative for seizures and headaches.  Psychiatric/Behavioral: Negative for hallucinations.    Allergies  Review of patient's allergies indicates no known allergies.  Home Medications   Current Outpatient Rx  Name  Route  Sig  Dispense  Refill  . ALPRAZolam (XANAX) 1 MG tablet   Oral   Take 1 mg by mouth 3  (three) times daily.         . Carboxymethylcellulose Sodium (THERATEARS) 0.25 % SOLN   Ophthalmic   Apply 1-2 drops to eye daily as needed (for dry eye relief).         . cyclobenzaprine (FLEXERIL) 10 MG tablet   Oral   Take 1 tablet (10 mg total) by mouth 2 (two) times daily as needed for muscle spasms.   20 tablet   0   . diclofenac (FLECTOR) 1.3 % PTCH   Transdermal   Place 1 patch onto the skin daily.         . fluticasone (FLONASE) 50 MCG/ACT nasal spray   Nasal   Place 2 sprays into the nose daily.         . Gabapentin, PHN, (GRALISE) 600 MG TABS   Oral   Take 600 mg by mouth 3 (three) times daily.         Marland Kitchen HYDROcodone-acetaminophen (NORCO/VICODIN) 5-325 MG per tablet   Oral   Take 1 tablet by mouth every 6 (six) hours as needed for pain.         Marland Kitchen ibuprofen (ADVIL,MOTRIN) 800 MG tablet   Oral   Take 1 tablet (800 mg total) by mouth 3 (three) times daily.   21 tablet   0   . ondansetron (ZOFRAN) 4 MG tablet   Oral   Take 4  mg by mouth every 8 (eight) hours as needed. Nausea and Vomiting         . PARoxetine (PAXIL-CR) 25 MG 24 hr tablet   Oral   Take 25 mg by mouth every morning.         Marland Kitchen rOPINIRole (REQUIP) 0.25 MG tablet   Oral   Take 0.25 mg by mouth at bedtime.          . topiramate (TOPAMAX) 50 MG tablet   Oral   Take 50 mg by mouth at bedtime.          . Travoprost, BAK Free, (TRAVATAN) 0.004 % SOLN ophthalmic solution   Both Eyes   Place 1 drop into both eyes at bedtime.           BP 142/94  Pulse 98  Temp(Src) 99 F (37.2 C) (Oral)  Resp 18  Ht 5\' 2"  (1.575 m)  Wt 160 lb (72.576 kg)  BMI 29.26 kg/m2  SpO2 99%  Physical Exam  Nursing note and vitals reviewed. Constitutional: She is oriented to person, place, and time. She appears well-developed.  HENT:  Head: Normocephalic and atraumatic.  Pharynx mildly inflammed  Eyes: Conjunctivae and EOM are normal. No scleral icterus.  Neck: Neck supple. No thyromegaly  present.  Bilateral anterior lymph nodes tender to palpation  Cardiovascular: Normal rate and regular rhythm.  Exam reveals no gallop and no friction rub.   No murmur heard. Pulmonary/Chest: No stridor. She has no wheezes. She has no rales. She exhibits no tenderness.  Abdominal: She exhibits no distension. There is no tenderness. There is no rebound.  Musculoskeletal: Normal range of motion. She exhibits no edema.  Neurological: She is oriented to person, place, and time. Coordination normal.  Skin: No rash noted. No erythema.  Psychiatric: She has a normal mood and affect. Her behavior is normal.    ED Course  Procedures (including critical care time) DIAGNOSTIC STUDIES: Oxygen Saturation is 99% on room air, normal by my interpretation.    COORDINATION OF CARE: 9:31 PM- Patient informed of clinical course, understands medical decision-making process, and agrees with plan.   Results for orders placed during the hospital encounter of 11/05/12  RAPID STREP SCREEN      Result Value Range   Streptococcus, Group A Screen (Direct) NEGATIVE  NEGATIVE     No diagnosis found.    MDM     The chart was scribed for me under my direct supervision.  I personally performed the history, physical, and medical decision making and all procedures in the evaluation of this patient.Ruth Lennert, MD 11/05/12 [phone]

## 2012-11-05 NOTE — ED Notes (Signed)
Sore throat today and low back pain.

## 2012-11-14 ENCOUNTER — Emergency Department (HOSPITAL_COMMUNITY)
Admission: EM | Admit: 2012-11-14 | Discharge: 2012-11-14 | Disposition: A | Discharge status: Home / Self Care | Payer: Medicare Other | Attending: Emergency Medicine | Admitting: Emergency Medicine

## 2012-11-14 ENCOUNTER — Encounter (HOSPITAL_COMMUNITY): Payer: Self-pay | Admitting: *Deleted

## 2012-11-14 DIAGNOSIS — M545 Low back pain, unspecified: Secondary | ICD-10-CM | POA: Insufficient documentation

## 2012-11-14 DIAGNOSIS — I10 Essential (primary) hypertension: Secondary | ICD-10-CM | POA: Insufficient documentation

## 2012-11-14 DIAGNOSIS — Z8739 Personal history of other diseases of the musculoskeletal system and connective tissue: Secondary | ICD-10-CM | POA: Insufficient documentation

## 2012-11-14 DIAGNOSIS — F411 Generalized anxiety disorder: Secondary | ICD-10-CM | POA: Insufficient documentation

## 2012-11-14 DIAGNOSIS — J029 Acute pharyngitis, unspecified: Secondary | ICD-10-CM | POA: Insufficient documentation

## 2012-11-14 DIAGNOSIS — Z79899 Other long term (current) drug therapy: Secondary | ICD-10-CM | POA: Insufficient documentation

## 2012-11-14 DIAGNOSIS — F3289 Other specified depressive episodes: Secondary | ICD-10-CM | POA: Insufficient documentation

## 2012-11-14 DIAGNOSIS — F329 Major depressive disorder, single episode, unspecified: Secondary | ICD-10-CM | POA: Insufficient documentation

## 2012-11-14 DIAGNOSIS — Z8669 Personal history of other diseases of the nervous system and sense organs: Secondary | ICD-10-CM | POA: Insufficient documentation

## 2012-11-14 DIAGNOSIS — M549 Dorsalgia, unspecified: Secondary | ICD-10-CM

## 2012-11-14 DIAGNOSIS — K219 Gastro-esophageal reflux disease without esophagitis: Secondary | ICD-10-CM | POA: Insufficient documentation

## 2012-11-14 MED ORDER — CYCLOBENZAPRINE HCL 10 MG PO TABS
10.0000 mg | ORAL_TABLET | Freq: Once | ORAL | Status: AC
  Administered 2012-11-14: 10 mg via ORAL
  Filled 2012-11-14: qty 1

## 2012-11-14 MED ORDER — MAGIC MOUTHWASH W/LIDOCAINE: ORAL | Status: DC

## 2012-11-14 MED ORDER — KETOROLAC TROMETHAMINE 60 MG/2ML IM SOLN
60.0000 mg | Freq: Once | INTRAMUSCULAR | Status: AC
  Administered 2012-11-14: 60 mg via INTRAMUSCULAR
  Filled 2012-11-14: qty 2

## 2012-11-14 NOTE — ED Notes (Signed)
Pt states lower back pain (which is chronic) radiating down both legs which bega at 2330. Also states she just completed antibiotics for strep throat and her throat is still hurting.

## 2012-11-17 NOTE — ED Provider Notes (Signed)
History     CSN: [id number]  Arrival date & time 11/14/12  1547   First MD Initiated Contact with Patient 11/14/12 1613      Chief Complaint  Patient presents with  . Back Pain  . Sore Throat    (Consider location/radiation/quality/duration/timing/severity/associated sxs/prior treatment) HPI Comments: Patient with hx of recurrent low back pain c/o increasing pain that began on the evening prior to ED arrival.  States the pain radiates into both legs.  She describes the pain as sharp and throbbing.  She denies recent injury, numbness or weakness of the LE's, incontinence of bladder or bowel, or dysuria.  She states the pain is similar to previous low back pain.  She also c/o sore throat for 2 weeks.  States she was seen here on 11/05/12 and had a negative strep screen , but states her throat pain has not improved.  She denies difficulty swallowing, fever, rash or swelling of the tonsils.    Patient is a 47 y.o. female presenting with back pain and pharyngitis. The history is provided by the patient.  Back Pain Location:  Lumbar spine Quality:  Aching Radiates to:  R posterior upper leg, L posterior upper leg, R thigh and L thigh Pain severity:  Moderate Pain is:  Same all the time Onset quality:  Gradual Duration:  1 day Progression:  Worsening Chronicity:  Recurrent Context: not emotional stress, not falling, not lifting heavy objects, not recent illness, not recent injury and not twisting   Relieved by:  Nothing Worsened by:  Ambulation, bending, movement, twisting and sitting Ineffective treatments:  NSAIDs Associated symptoms: leg pain   Associated symptoms: no abdominal pain, no abdominal swelling, no bladder incontinence, no bowel incontinence, no chest pain, no dysuria, no fever, no headaches, no numbness, no paresthesias, no pelvic pain, no perianal numbness, no tingling and no weakness   Sore Throat Associated symptoms include a sore throat. Pertinent negatives include no  abdominal pain, chest pain, congestion, fever, headaches, joint swelling, neck pain, numbness, rash, vomiting or weakness.    Past Medical History  Diagnosis Date  . Chronic back pain   . Hypertension   . Depression   . Anxiety   . Acid reflux   . Restless leg syndrome     Past Surgical History  Procedure Laterality Date  . Tubal ligation    . Ablasion      No family history on file.  History  Substance Use Topics  . Smoking status: Never Smoker   . Smokeless tobacco: Not on file  . Alcohol Use: No    OB History   Grav Para Term Preterm Abortions TAB SAB Ect Mult Living                  Review of Systems  Constitutional: Negative for fever.  HENT: Positive for sore throat. Negative for congestion, facial swelling, sneezing, mouth sores, trouble swallowing, neck pain, neck stiffness, voice change and sinus pressure.   Respiratory: Negative for shortness of breath.   Cardiovascular: Negative for chest pain.  Gastrointestinal: Negative for vomiting, abdominal pain, constipation and bowel incontinence.  Genitourinary: Negative for bladder incontinence, dysuria, hematuria, flank pain, decreased urine volume, vaginal bleeding, vaginal discharge, difficulty urinating and pelvic pain.       No perineal numbness or incontinence of urine or feces  Musculoskeletal: Positive for back pain. Negative for joint swelling.  Skin: Negative for rash.  Neurological: Negative for tingling, weakness, numbness, headaches and paresthesias.  All other  systems reviewed and are negative.    Allergies  Review of patient's allergies indicates no known allergies.  Home Medications   Current Outpatient Rx  Name  Route  Sig  Dispense  Refill  . ALPRAZolam (XANAX) 1 MG tablet   Oral   Take 1 mg by mouth 3 (three) times daily.         . diclofenac (FLECTOR) 1.3 % PTCH   Transdermal   Place 1 patch onto the skin daily.         . fluticasone (FLONASE) 50 MCG/ACT nasal spray   Nasal    Place 2 sprays into the nose daily.         . Gabapentin, PHN, (GRALISE) 600 MG TABS   Oral   Take 600 mg by mouth 3 (three) times daily.         Marland Kitchen ibuprofen (ADVIL,MOTRIN) 800 MG tablet   Oral   Take 1 tablet (800 mg total) by mouth 3 (three) times daily.   21 tablet   0   . Olopatadine HCl (PATADAY) 0.2 % SOLN   Both Eyes   Place 1 drop into both eyes daily.         . ondansetron (ZOFRAN) 4 MG tablet   Oral   Take 4 mg by mouth every 8 (eight) hours as needed. Nausea and Vomiting         . PARoxetine (PAXIL-CR) 25 MG 24 hr tablet   Oral   Take 25 mg by mouth every morning.         Marland Kitchen rOPINIRole (REQUIP) 0.25 MG tablet   Oral   Take 0.25 mg by mouth at bedtime.          . Travoprost, BAK Free, (TRAVATAN) 0.004 % SOLN ophthalmic solution   Both Eyes   Place 1 drop into both eyes at bedtime.         . Alum & Mag Hydroxide-Simeth (MAGIC MOUTHWASH W/LIDOCAINE) SOLN      5 ml po swish and spit TID prn.  Do not swallow   120 mL   0     BP 154/96  Pulse 106  Temp(Src) 98.3 F (36.8 C) (Oral)  Resp 20  Ht 5\' 2"  (1.575 m)  Wt 160 lb (72.576 kg)  BMI 29.26 kg/m2  SpO2 98%  Physical Exam  Nursing note and vitals reviewed. Constitutional: She is oriented to person, place, and time. She appears well-developed and well-nourished. No distress.  HENT:  Head: Normocephalic and atraumatic.  Right Ear: Tympanic membrane and ear canal normal.  Left Ear: Tympanic membrane and ear canal normal.  Mouth/Throat: Uvula is midline and mucous membranes are normal. Posterior oropharyngeal erythema present. No oropharyngeal exudate, posterior oropharyngeal edema or tonsillar abscesses.  Neck: Normal range of motion. Neck supple.  Cardiovascular: Normal rate, regular rhythm, normal heart sounds and intact distal pulses.   No murmur heard. Pulmonary/Chest: Effort normal and breath sounds normal. No respiratory distress.  Musculoskeletal: She exhibits tenderness. She  exhibits no edema.       Lumbar back: She exhibits tenderness and pain. She exhibits normal range of motion, no bony tenderness, no swelling, no deformity, no laceration and normal pulse.       Back:  ttp of the bilateral lumbar paraspinal muscles.  DP pulses are brisk and symmetrical, distal sensation intact.  No calf pain or edema.    Lymphadenopathy:    She has no cervical adenopathy.       Right cervical:  No superficial cervical and no deep cervical adenopathy present.      Left cervical: No superficial cervical and no deep cervical adenopathy present.  Neurological: She is alert and oriented to person, place, and time. No cranial nerve deficit or sensory deficit. She exhibits normal muscle tone. Coordination and gait normal.  Reflex Scores:      Patellar reflexes are 2+ on the right side and 2+ on the left side.      Achilles reflexes are 2+ on the right side and 2+ on the left side. Skin: Skin is warm and dry.    ED Course  Procedures (including critical care time)  Labs Reviewed - No data to display No results found.   1. Back pain, chronic   2. Pharyngitis       MDM     Patient has ttp of the lumbar paraspinal muscles.  No focal neuro deficits on exam.  Ambulates with a steady gait.   Doubt emergent neurological or infectious process.    Pt agrees to close f/u with her PMD.    The patient appears reasonably screened and/or stabilized for discharge and I doubt any other medical condition or other [facility] requiring further screening, evaluation, or treatment in the ED at this time prior to discharge.         Dellie Piasecki L. Trisha Mangle, PA-C 11/17/12 2349

## 2012-11-18 NOTE — ED Provider Notes (Signed)
Medical screening examination/treatment/procedure(s) were performed by non-physician practitioner and as supervising physician I was immediately available for consultation/collaboration.   Glynn Octave, MD 11/18/12 [phone]

## 2012-11-27 ENCOUNTER — Encounter (HOSPITAL_COMMUNITY): Payer: Self-pay | Admitting: *Deleted

## 2012-11-27 ENCOUNTER — Telehealth (HOSPITAL_COMMUNITY): Payer: Self-pay | Admitting: Emergency Medicine

## 2012-11-27 ENCOUNTER — Emergency Department (HOSPITAL_COMMUNITY)
Admission: EM | Admit: 2012-11-27 | Discharge: 2012-11-27 | Disposition: A | Discharge status: Home / Self Care | Payer: Medicare Other | Attending: Emergency Medicine | Admitting: Emergency Medicine

## 2012-11-27 DIAGNOSIS — I10 Essential (primary) hypertension: Secondary | ICD-10-CM | POA: Insufficient documentation

## 2012-11-27 DIAGNOSIS — M549 Dorsalgia, unspecified: Secondary | ICD-10-CM | POA: Insufficient documentation

## 2012-11-27 DIAGNOSIS — G8929 Other chronic pain: Secondary | ICD-10-CM | POA: Insufficient documentation

## 2012-11-27 DIAGNOSIS — K089 Disorder of teeth and supporting structures, unspecified: Secondary | ICD-10-CM | POA: Insufficient documentation

## 2012-11-27 DIAGNOSIS — R070 Pain in throat: Secondary | ICD-10-CM | POA: Insufficient documentation

## 2012-11-27 DIAGNOSIS — Z79899 Other long term (current) drug therapy: Secondary | ICD-10-CM | POA: Insufficient documentation

## 2012-11-27 DIAGNOSIS — IMO0002 Reserved for concepts with insufficient information to code with codable children: Secondary | ICD-10-CM | POA: Insufficient documentation

## 2012-11-27 DIAGNOSIS — K0889 Other specified disorders of teeth and supporting structures: Secondary | ICD-10-CM

## 2012-11-27 DIAGNOSIS — Z8709 Personal history of other diseases of the respiratory system: Secondary | ICD-10-CM | POA: Insufficient documentation

## 2012-11-27 DIAGNOSIS — J029 Acute pharyngitis, unspecified: Secondary | ICD-10-CM | POA: Insufficient documentation

## 2012-11-27 DIAGNOSIS — F329 Major depressive disorder, single episode, unspecified: Secondary | ICD-10-CM | POA: Insufficient documentation

## 2012-11-27 DIAGNOSIS — K219 Gastro-esophageal reflux disease without esophagitis: Secondary | ICD-10-CM | POA: Insufficient documentation

## 2012-11-27 DIAGNOSIS — F3289 Other specified depressive episodes: Secondary | ICD-10-CM | POA: Insufficient documentation

## 2012-11-27 DIAGNOSIS — F411 Generalized anxiety disorder: Secondary | ICD-10-CM | POA: Insufficient documentation

## 2012-11-27 MED ORDER — HYDROCODONE-ACETAMINOPHEN 5-325 MG PO TABS: 1.0000 | ORAL_TABLET | ORAL | Status: DC | PRN

## 2012-11-27 NOTE — ED Provider Notes (Signed)
History/physical exam/procedure(s) were performed by non-physician practitioner and as supervising physician I was immediately available for consultation/collaboration. I have reviewed all notes and am in agreement with care and plan.   Hilario Quarry, MD 11/27/12 [phone]

## 2012-11-27 NOTE — ED Provider Notes (Signed)
History     CSN: [id number]  Arrival date & time 11/27/12  1607   First MD Initiated Contact with Patient 11/27/12 1613      Chief Complaint  Patient presents with  . Dental Pain  . Sore Throat    (Consider location/radiation/quality/duration/timing/severity/associated sxs/prior treatment) HPI Comments: Ruth Rose is a 47 y.o. Female presenting with left upper dental pain since having her 1st and second molars pulled 2 days ago by her dentist in [place].  Additionally,  She describes chronic throat pain and has had a history of frequent episodes of tonsillitis,  Most recently about 1 month ago.  She has been seen by an ENT physician in [place] and had a Ct scan last week to determine if she needs a tonsillectomy.  She has persistent throat pain which is not new or different,  But she mentions this as both of these 2 sources of pain have made it difficult for her to sleep the past 2 nights.  She denies fevers, chills,  Nasal or sinus congestions, cough and shortness of breath.  She is taking ibuprofen which does not relieve her pain.     The history is provided by the patient.    Past Medical History  Diagnosis Date  . Chronic back pain   . Hypertension   . Depression   . Anxiety   . Acid reflux   . Restless leg syndrome     Past Surgical History  Procedure Laterality Date  . Tubal ligation    . Ablasion      No family history on file.  History  Substance Use Topics  . Smoking status: Never Smoker   . Smokeless tobacco: Not on file  . Alcohol Use: No    OB History   Grav Para Term Preterm Abortions TAB SAB Ect Mult Living                  Review of Systems  Constitutional: Negative for fever and chills.  HENT: Positive for sore throat and dental problem. Negative for facial swelling, trouble swallowing, neck pain, neck stiffness and voice change.   Respiratory: Negative for shortness of breath.     Allergies  Review of patient's allergies indicates no known  allergies.  Home Medications   Current Outpatient Rx  Name  Route  Sig  Dispense  Refill  . ALPRAZolam (XANAX) 1 MG tablet   Oral   Take 1 mg by mouth 3 (three) times daily.         . Alum & Mag Hydroxide-Simeth (MAGIC MOUTHWASH W/LIDOCAINE) SOLN   Oral   Take 5 mLs by mouth 3 (three) times daily as needed. Pain (Swish and Spit)         . diclofenac (FLECTOR) 1.3 % PTCH   Transdermal   Place 1 patch onto the skin daily.         . fluticasone (FLONASE) 50 MCG/ACT nasal spray   Nasal   Place 2 sprays into the nose daily.         . Gabapentin, PHN, (GRALISE) 600 MG TABS   Oral   Take 600 mg by mouth 3 (three) times daily.         Marland Kitchen ibuprofen (ADVIL,MOTRIN) 800 MG tablet   Oral   Take 1 tablet (800 mg total) by mouth 3 (three) times daily.   21 tablet   0   . Olopatadine HCl (PATADAY) 0.2 % SOLN   Both Eyes   Place  1 drop into both eyes daily.         Marland Kitchen PARoxetine (PAXIL-CR) 25 MG 24 hr tablet   Oral   Take 25 mg by mouth every morning.         Marland Kitchen rOPINIRole (REQUIP) 0.25 MG tablet   Oral   Take 0.25 mg by mouth at bedtime.          . Travoprost, BAK Free, (TRAVATAN) 0.004 % SOLN ophthalmic solution   Both Eyes   Place 1 drop into both eyes at bedtime.         Marland Kitchen HYDROcodone-acetaminophen (NORCO/VICODIN) 5-325 MG per tablet   Oral   Take 1 tablet by mouth every 4 (four) hours as needed for pain.   15 tablet   0   . ondansetron (ZOFRAN) 4 MG tablet   Oral   Take 4 mg by mouth every 8 (eight) hours as needed. Nausea and Vomiting           BP 142/86  Pulse 90  Temp(Src) 98.5 F (36.9 C) (Oral)  Resp 16  SpO2 100%  Physical Exam  Constitutional: She is oriented to person, place, and time. She appears well-developed and well-nourished. No distress.  HENT:  Head: Normocephalic and atraumatic.  Right Ear: Tympanic membrane and external ear normal.  Left Ear: Tympanic membrane and external ear normal.  Mouth/Throat: Oropharynx is clear  and moist and mucous membranes are normal. No oral lesions. No dental abscesses. No oropharyngeal exudate, posterior oropharyngeal edema or posterior oropharyngeal erythema.  2 normal appearing sites of dental extraction left upper molars.  No drainage,  No erythema.  Eyes: Conjunctivae are normal.  Neck: Normal range of motion. Neck supple.  Cardiovascular: Normal rate and normal heart sounds.   Pulmonary/Chest: Effort normal.  Abdominal: She exhibits no distension.  Musculoskeletal: Normal range of motion.  Lymphadenopathy:    She has no cervical adenopathy.  Neurological: She is alert and oriented to person, place, and time.  Skin: Skin is warm and dry. No erythema.  Psychiatric: She has a normal mood and affect.    ED Course  Procedures (including critical care time)  Labs Reviewed - No data to display No results found.   1. Pain, dental       MDM  Pt prescribed hydrocodone.  Encouraged to f/u with dentist on Monday if she is still having pain.         Burgess Amor, PA-C 11/27/12 1644

## 2012-11-27 NOTE — ED Notes (Signed)
Pt c/o dental pain, had two teeth pulled this week, has been taking ibu without relief, sore throat that started last night,

## 2013-01-11 ENCOUNTER — Telehealth: Payer: Self-pay | Admitting: *Deleted

## 2013-01-12 MED ORDER — FLUCONAZOLE 150 MG PO TABS: 150.0000 mg | ORAL_TABLET | Freq: Once | ORAL | Status: DC

## 2013-01-12 NOTE — Telephone Encounter (Signed)
C/o vaginal discharge with burning and itching. Pt states has been on antibiotics and thinks she has a yeast infection.  Can you e-scribe Diflucan?

## 2013-01-12 NOTE — Telephone Encounter (Signed)
Diflucan for yeast

## 2013-09-26 ENCOUNTER — Other Ambulatory Visit: Payer: Self-pay | Admitting: Obstetrics & Gynecology

## 2013-09-26 ENCOUNTER — Encounter: Payer: Self-pay | Admitting: *Deleted

## 2013-10-16 ENCOUNTER — Emergency Department (HOSPITAL_COMMUNITY): Payer: Medicare Other

## 2013-10-16 ENCOUNTER — Emergency Department (HOSPITAL_COMMUNITY)
Admission: EM | Admit: 2013-10-16 | Discharge: 2013-10-16 | Disposition: A | Discharge status: Home / Self Care | Payer: Medicare Other | Attending: Emergency Medicine | Admitting: Emergency Medicine

## 2013-10-16 ENCOUNTER — Encounter (HOSPITAL_COMMUNITY): Payer: Self-pay | Admitting: Emergency Medicine

## 2013-10-16 DIAGNOSIS — F411 Generalized anxiety disorder: Secondary | ICD-10-CM | POA: Insufficient documentation

## 2013-10-16 DIAGNOSIS — Y929 Unspecified place or not applicable: Secondary | ICD-10-CM | POA: Insufficient documentation

## 2013-10-16 DIAGNOSIS — I10 Essential (primary) hypertension: Secondary | ICD-10-CM | POA: Insufficient documentation

## 2013-10-16 DIAGNOSIS — Z79899 Other long term (current) drug therapy: Secondary | ICD-10-CM | POA: Insufficient documentation

## 2013-10-16 DIAGNOSIS — M549 Dorsalgia, unspecified: Secondary | ICD-10-CM | POA: Insufficient documentation

## 2013-10-16 DIAGNOSIS — S93409A Sprain of unspecified ligament of unspecified ankle, initial encounter: Secondary | ICD-10-CM

## 2013-10-16 DIAGNOSIS — Z791 Long term (current) use of non-steroidal anti-inflammatories (NSAID): Secondary | ICD-10-CM | POA: Insufficient documentation

## 2013-10-16 DIAGNOSIS — S8010XA Contusion of unspecified lower leg, initial encounter: Secondary | ICD-10-CM | POA: Insufficient documentation

## 2013-10-16 DIAGNOSIS — X500XXA Overexertion from strenuous movement or load, initial encounter: Secondary | ICD-10-CM | POA: Insufficient documentation

## 2013-10-16 DIAGNOSIS — Y9389 Activity, other specified: Secondary | ICD-10-CM | POA: Insufficient documentation

## 2013-10-16 DIAGNOSIS — Z8719 Personal history of other diseases of the digestive system: Secondary | ICD-10-CM | POA: Insufficient documentation

## 2013-10-16 DIAGNOSIS — F3289 Other specified depressive episodes: Secondary | ICD-10-CM | POA: Insufficient documentation

## 2013-10-16 DIAGNOSIS — Z8781 Personal history of (healed) traumatic fracture: Secondary | ICD-10-CM | POA: Insufficient documentation

## 2013-10-16 DIAGNOSIS — Z8669 Personal history of other diseases of the nervous system and sense organs: Secondary | ICD-10-CM | POA: Insufficient documentation

## 2013-10-16 DIAGNOSIS — F329 Major depressive disorder, single episode, unspecified: Secondary | ICD-10-CM | POA: Insufficient documentation

## 2013-10-16 DIAGNOSIS — G8921 Chronic pain due to trauma: Secondary | ICD-10-CM | POA: Insufficient documentation

## 2013-10-16 HISTORY — DX: Fibromyalgia: M79.7

## 2013-10-16 MED ORDER — ONDANSETRON 4 MG PO TBDP
4.0000 mg | ORAL_TABLET | Freq: Once | ORAL | Status: AC
  Administered 2013-10-16: 4 mg via ORAL
  Filled 2013-10-16: qty 1

## 2013-10-16 MED ORDER — IBUPROFEN 800 MG PO TABS
800.0000 mg | ORAL_TABLET | Freq: Once | ORAL | Status: AC
  Administered 2013-10-16: 800 mg via ORAL
  Filled 2013-10-16: qty 1

## 2013-10-16 MED ORDER — ACETAMINOPHEN 325 MG PO TABS
650.0000 mg | ORAL_TABLET | Freq: Once | ORAL | Status: AC
  Administered 2013-10-16: 650 mg via ORAL
  Filled 2013-10-16: qty 2

## 2013-10-16 NOTE — ED Notes (Signed)
Pt was going down steps on Fri and turned her L ankle. Now reports she is unable to walk bearing weight on that side.

## 2013-10-16 NOTE — ED Notes (Signed)
Pt reports taking hydrocodone and tramadol at home for pain, has pain contract.
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
## 2013-10-16 NOTE — Discharge Instructions (Signed)
The x-ray of your ankle and foot are negative for fracture or dislocation. Please use the ankle stirrup splint for the next 7-10 days. Please elevate your ankle is much as possible. Please see your physician for additional evaluation and management, or orthopedic referral if not improving. Please use ibuprofen and Tylenol extra strength 3 times daily for your discomfort. Ankle Sprain An ankle sprain is an injury to the strong, fibrous tissues (ligaments) that hold the bones of your ankle joint together.  CAUSES An ankle sprain is usually caused by a fall or by twisting your ankle. Ankle sprains most commonly occur when you step on the outer edge of your foot, and your ankle turns inward. People who participate in sports are more prone to these types of injuries.  SYMPTOMS   Pain in your ankle. The pain may be present at rest or only when you are trying to stand or walk.  Swelling.  Bruising. Bruising may develop immediately or within 1 to 2 days after your injury.  Difficulty standing or walking, particularly when turning corners or changing directions. DIAGNOSIS  Your caregiver will ask you details about your injury and perform a physical exam of your ankle to determine if you have an ankle sprain. During the physical exam, your caregiver will press on and apply pressure to specific areas of your foot and ankle. Your caregiver will try to move your ankle in certain ways. An X-ray exam may be done to be sure a bone was not broken or a ligament did not separate from one of the bones in your ankle (avulsion fracture).  TREATMENT  Certain types of braces can help stabilize your ankle. Your caregiver can make a recommendation for this. Your caregiver may recommend the use of medicine for pain. If your sprain is severe, your caregiver may refer you to a surgeon who helps to restore function to parts of your skeletal system (orthopedist) or a physical therapist. HOME CARE INSTRUCTIONS   Apply ice to  your injury for 1 2 days or as directed by your caregiver. Applying ice helps to reduce inflammation and pain.  Put ice in a plastic bag.  Place a towel between your skin and the bag.  Leave the ice on for 15-20 minutes at a time, every 2 hours while you are awake.  Only take over-the-counter or prescription medicines for pain, discomfort, or fever as directed by your caregiver.  Elevate your injured ankle above the level of your heart as much as possible for 2 3 days.  If your caregiver recommends crutches, use them as instructed. Gradually put weight on the affected ankle. Continue to use crutches or a cane until you can walk without feeling pain in your ankle.  If you have a plaster splint, wear the splint as directed by your caregiver. Do not rest it on anything harder than a pillow for the first 24 hours. Do not put weight on it. Do not get it wet. You may take it off to take a shower or bath.  You may have been given an elastic bandage to wear around your ankle to provide support. If the elastic bandage is too tight (you have numbness or tingling in your foot or your foot becomes cold and blue), adjust the bandage to make it comfortable.  If you have an air splint, you may blow more air into it or let air out to make it more comfortable. You may take your splint off at night and before taking a  shower or bath. Wiggle your toes in the splint several times per day to decrease swelling. SEEK MEDICAL CARE IF:   You have rapidly increasing bruising or swelling.  Your toes feel extremely cold or you lose feeling in your foot.  Your pain is not relieved with medicine. SEEK IMMEDIATE MEDICAL CARE IF:  Your toes are numb or blue.  You have severe pain that is increasing. MAKE SURE YOU:   Understand these instructions.  Will watch your condition.  Will get help right away if you are not doing well or get worse. Document Released: 07/21/2005 Document Revised: 04/14/2012 Document  Reviewed: 08/02/2011 [facility] Patient Information 2014 [place], [place].

## 2013-10-19 NOTE — ED Provider Notes (Signed)
Medical screening examination/treatment/procedure(s) were performed by non-physician practitioner and as supervising physician I was immediately available for consultation/collaboration.   EKG Interpretation None        Rolland PorterMark Tamie Minteer, MD 10/19/13 1635

## 2013-12-06 ENCOUNTER — Ambulatory Visit (INDEPENDENT_AMBULATORY_CARE_PROVIDER_SITE_OTHER): Payer: Medicare Other | Admitting: Advanced Practice Midwife

## 2013-12-06 ENCOUNTER — Encounter: Payer: Self-pay | Admitting: Obstetrics & Gynecology

## 2013-12-06 ENCOUNTER — Other Ambulatory Visit (HOSPITAL_COMMUNITY)
Admission: RE | Admit: 2013-12-06 | Discharge: 2013-12-06 | Disposition: A | Discharge status: Home / Self Care | Payer: Medicare Other | Source: Ambulatory Visit | Attending: Advanced Practice Midwife | Admitting: Advanced Practice Midwife

## 2013-12-06 VITALS — BP 110/80 | Ht 60.0 in | Wt 139.0 lb

## 2013-12-06 DIAGNOSIS — Z124 Encounter for screening for malignant neoplasm of cervix: Secondary | ICD-10-CM | POA: Insufficient documentation

## 2013-12-06 DIAGNOSIS — M549 Dorsalgia, unspecified: Secondary | ICD-10-CM

## 2013-12-06 DIAGNOSIS — Z1151 Encounter for screening for human papillomavirus (HPV): Secondary | ICD-10-CM | POA: Insufficient documentation

## 2013-12-06 DIAGNOSIS — K219 Gastro-esophageal reflux disease without esophagitis: Secondary | ICD-10-CM | POA: Insufficient documentation

## 2013-12-06 DIAGNOSIS — G8929 Other chronic pain: Secondary | ICD-10-CM | POA: Insufficient documentation

## 2013-12-06 DIAGNOSIS — Z01419 Encounter for gynecological examination (general) (routine) without abnormal findings: Secondary | ICD-10-CM

## 2013-12-06 DIAGNOSIS — M797 Fibromyalgia: Secondary | ICD-10-CM | POA: Insufficient documentation

## 2013-12-06 NOTE — Progress Notes (Signed)
Ruth Mcmurrayebra M Rose 48 y.o.  Filed Vitals:   12/06/13 1037  BP: 110/80     Past Medical History: Past Medical History  Diagnosis Date  . Chronic back pain   . Hypertension   . Depression   . Anxiety   . Acid reflux   . Restless leg syndrome   . Fibromyalgia     Past Surgical History: Past Surgical History  Procedure Laterality Date  . Tubal ligation    . Ablasion      Family History: Family History  Problem Relation Age of Onset  . Cancer Mother   . Cancer Father   . Seizures Father   . Cancer Other   . Heart failure Other     Social History: History  Substance Use Topics  . Smoking status: Never Smoker   . Smokeless tobacco: Not on file  . Alcohol Use: No    Allergies: No Known Allergies   History of Present Illness:  Here for well woman exam.  [facility]  Medicare pays for them q 3 years per pt. Has never had routine bloodwork.  Not fasting today. Does not have a PCP  Current outpatient prescriptions:ALPRAZolam (XANAX) 1 MG tablet, Take 1 mg by mouth 3 (three) times daily., Disp: , Rfl: ;  amitriptyline (ELAVIL) 10 MG tablet, Take 10 mg by mouth at bedtime., Disp: , Rfl: ;  diclofenac (FLECTOR) 1.3 % PTCH, Place 1 patch onto the skin daily., Disp: , Rfl: ;  Gabapentin, PHN, (GRALISE) 600 MG TABS, Take 1 tablet by mouth 3 (three) times daily., Disp: , Rfl:  HYDROcodone-acetaminophen (NORCO/VICODIN) 5-325 MG per tablet, Take 1 tablet by mouth every 4 (four) hours as needed for pain., Disp: 15 tablet, Rfl: 0;  Milnacipran (SAVELLA) 50 MG TABS tablet, Take 50 mg by mouth daily., Disp: , Rfl: ;  rOPINIRole (REQUIP) 0.25 MG tablet, Take 0.25 mg by mouth at bedtime. , Disp: , Rfl:  SUMAtriptan (IMITREX) 50 MG tablet, Take 50 mg by mouth every 2 (two) hours as needed for migraine or headache. May repeat in 2 hours if headache persists or recurs., Disp: , Rfl: ;  traMADol (ULTRAM) 50 MG tablet, Take 50 mg by mouth every 6  (six) hours as needed for moderate pain., Disp: , Rfl:  Vitamin D, Ergocalciferol, (DRISDOL) 50000 UNITS CAPS capsule, Take 50,000 Units by mouth 2 (two) times a week. Takes on Sunday and Friday, Disp: , Rfl: ;  cetirizine (ZYRTEC) 10 MG tablet, Take 10 mg by mouth daily., Disp: , Rfl: ;  gabapentin (NEURONTIN) 100 MG capsule, Take 200 mg by mouth at bedtime., Disp: , Rfl:     Review of Systems   Patient denies any headaches, blurred vision, shortness of breath, chest pain, abdominal pain, problems with bowel movements, urination, or intercourse.   Physical Exam: General:  Well developed, well nourished, no acute distress Skin:  Warm and dry Neck:  Midline trachea, normal thyroid Lungs; Clear to auscultation bilaterally Breast:  No dominant palpable mass, retraction, or nipple discharge Cardiovascular: Regular rate and rhythm Abdomen:  Soft, non tender, no hepatosplenomegaly Pelvic:  External genitalia is normal in appearance.  The vagina is normal in appearance.  e cervix is bulbous.  Uterus is felt to be normal size, shape, and contour.  No adnexal masses or tenderness noted. Rectal: Good sphincter tone, no polyps, or hemorrhoids felt.   Extremities:  No swelling or varicosities noted Psych:  No mood  changes   Impression: Normal GYN exam  Plan: :Labs when fasting

## 2013-12-13 ENCOUNTER — Other Ambulatory Visit: Payer: Medicare Other

## 2013-12-13 DIAGNOSIS — Z79899 Other long term (current) drug therapy: Secondary | ICD-10-CM

## 2013-12-13 DIAGNOSIS — Z13220 Encounter for screening for lipoid disorders: Secondary | ICD-10-CM

## 2013-12-13 DIAGNOSIS — Z0000 Encounter for general adult medical examination without abnormal findings: Secondary | ICD-10-CM

## 2013-12-13 LAB — CBC
HEMATOCRIT: 39.6 % (ref 36.0–46.0)
HEMOGLOBIN: 13.3 g/dL (ref 12.0–15.0)
MCH: 30.6 pg (ref 26.0–34.0)
MCHC: 33.6 g/dL (ref 30.0–36.0)
MCV: 91 fL (ref 78.0–100.0)
Platelets: 267 10*3/uL (ref 150–400)
RBC: 4.35 MIL/uL (ref 3.87–5.11)
RDW: 14.1 % (ref 11.5–15.5)
WBC: 6.9 10*3/uL (ref 4.0–10.5)

## 2013-12-14 ENCOUNTER — Telehealth: Payer: Self-pay | Admitting: Obstetrics and Gynecology

## 2013-12-14 LAB — COMPREHENSIVE METABOLIC PANEL
ALBUMIN: 4.3 g/dL (ref 3.5–5.2)
ALK PHOS: 67 U/L (ref 39–117)
ALT: 13 U/L (ref 0–35)
AST: 16 U/L (ref 0–37)
BILIRUBIN TOTAL: 0.9 mg/dL (ref 0.2–1.2)
BUN: 7 mg/dL (ref 6–23)
CO2: 22 mEq/L (ref 19–32)
Calcium: 9.4 mg/dL (ref 8.4–10.5)
Chloride: 108 mEq/L (ref 96–112)
Creat: 0.68 mg/dL (ref 0.50–1.10)
GLUCOSE: 79 mg/dL (ref 70–99)
Potassium: 4.1 mEq/L (ref 3.5–5.3)
Sodium: 141 mEq/L (ref 135–145)
Total Protein: 6.5 g/dL (ref 6.0–8.3)

## 2013-12-14 NOTE — Telephone Encounter (Signed)
Spoke with pt letting her know labs were within normal range. If I need to tell her anything else once you review it, please let me know. JSY

## 2013-12-27 ENCOUNTER — Telehealth: Payer: Self-pay | Admitting: Obstetrics and Gynecology

## 2013-12-27 NOTE — Telephone Encounter (Signed)
Pt informed of WNL pap (12/06/13) and WNL labs from 12/13/2013.

## 2013-12-27 NOTE — Telephone Encounter (Signed)
Left message x 1. JSY 

## 2014-04-22 ENCOUNTER — Encounter (HOSPITAL_COMMUNITY): Payer: Self-pay | Admitting: Emergency Medicine

## 2014-04-22 ENCOUNTER — Emergency Department (HOSPITAL_COMMUNITY)
Admission: EM | Admit: 2014-04-22 | Discharge: 2014-04-22 | Disposition: A | Discharge status: Home / Self Care | Payer: Medicare Other | Attending: Emergency Medicine | Admitting: Emergency Medicine

## 2014-04-22 DIAGNOSIS — Z79899 Other long term (current) drug therapy: Secondary | ICD-10-CM | POA: Insufficient documentation

## 2014-04-22 DIAGNOSIS — Z791 Long term (current) use of non-steroidal anti-inflammatories (NSAID): Secondary | ICD-10-CM | POA: Diagnosis not present

## 2014-04-22 DIAGNOSIS — M549 Dorsalgia, unspecified: Secondary | ICD-10-CM

## 2014-04-22 DIAGNOSIS — Z8739 Personal history of other diseases of the musculoskeletal system and connective tissue: Secondary | ICD-10-CM | POA: Insufficient documentation

## 2014-04-22 DIAGNOSIS — G8929 Other chronic pain: Secondary | ICD-10-CM | POA: Insufficient documentation

## 2014-04-22 DIAGNOSIS — I10 Essential (primary) hypertension: Secondary | ICD-10-CM | POA: Diagnosis not present

## 2014-04-22 DIAGNOSIS — M545 Low back pain, unspecified: Secondary | ICD-10-CM | POA: Diagnosis present

## 2014-04-22 DIAGNOSIS — F329 Major depressive disorder, single episode, unspecified: Secondary | ICD-10-CM | POA: Insufficient documentation

## 2014-04-22 DIAGNOSIS — Z8669 Personal history of other diseases of the nervous system and sense organs: Secondary | ICD-10-CM | POA: Insufficient documentation

## 2014-04-22 DIAGNOSIS — F3289 Other specified depressive episodes: Secondary | ICD-10-CM | POA: Diagnosis not present

## 2014-04-22 DIAGNOSIS — F411 Generalized anxiety disorder: Secondary | ICD-10-CM | POA: Diagnosis not present

## 2014-04-22 DIAGNOSIS — Z8719 Personal history of other diseases of the digestive system: Secondary | ICD-10-CM | POA: Diagnosis not present

## 2014-04-22 MED ORDER — OXYCODONE-ACETAMINOPHEN 5-325 MG PO TABS
2.0000 | ORAL_TABLET | Freq: Once | ORAL | Status: AC
  Administered 2014-04-22: 2 via ORAL
  Filled 2014-04-22: qty 2

## 2014-04-22 NOTE — ED Provider Notes (Signed)
CSN: [id number]     Arrival date & time 04/22/14  2059 History   First MD Initiated Contact with Patient 04/22/14 2156     Chief Complaint  Patient presents with  . Back Pain     (Consider location/radiation/quality/duration/timing/severity/associated sxs/prior Treatment) HPI Comments: Patient here with worsening chronic lower back pain. Denies any new injuries. Denies any saddle anesthesias. Pain starts in her lower back and does not radiate to her legs. No bowel or bladder dysfunction. Pain characterized as sharp and worse with movement and better with rest. Has used her home medications without relief. She is currently and pain management and has been compliant. Denies any urinary symptoms  Patient is a 48 y.o. female presenting with back pain. The history is provided by the patient.  Back Pain   Past Medical History  Diagnosis Date  . Chronic back pain   . Hypertension   . Depression   . Anxiety   . Acid reflux   . Restless leg syndrome   . Fibromyalgia    Past Surgical History  Procedure Laterality Date  . Tubal ligation    . Ablasion     Family History  Problem Relation Age of Onset  . Cancer Mother   . Cancer Father   . Seizures Father   . Cancer Other   . Heart failure Other    History  Substance Use Topics  . Smoking status: Never Smoker   . Smokeless tobacco: Not on file  . Alcohol Use: No   OB History   Grav Para Term Preterm Abortions TAB SAB Ect Mult Living   Review of Systems  Musculoskeletal: Positive for back pain.  All other systems reviewed and are negative.     Allergies  Review of patient's allergies indicates no known allergies.  Home Medications   Prior to Admission medications   Medication Sig Start Date End Date Taking? Authorizing Provider  ALPRAZolam Prudy Feeler) 1 MG tablet Take 1 mg by mouth 3 (three) times daily.    Historical Provider, MD  amitriptyline (ELAVIL) 10 MG tablet Take 10 mg by mouth at bedtime.     Historical Provider, MD  cetirizine (ZYRTEC) 10 MG tablet Take 10 mg by mouth daily.    Historical Provider, MD  diclofenac (FLECTOR) 1.3 % PTCH Place 1 patch onto the skin daily.    Historical Provider, MD  gabapentin (NEURONTIN) 100 MG capsule Take 200 mg by mouth at bedtime.    Historical Provider, MD  Gabapentin, PHN, (GRALISE) 600 MG TABS Take 1 tablet by mouth 3 (three) times daily.    Historical Provider, MD  HYDROcodone-acetaminophen (NORCO/VICODIN) 5-325 MG per tablet Take 1 tablet by mouth every 4 (four) hours as needed for pain. 11/27/12   Burgess Amor, PA-C  Milnacipran (SAVELLA) 50 MG TABS tablet Take 50 mg by mouth daily.    Historical Provider, MD  rOPINIRole (REQUIP) 0.25 MG tablet Take 0.25 mg by mouth at bedtime.     Historical Provider, MD  SUMAtriptan (IMITREX) 50 MG tablet Take 50 mg by mouth every 2 (two) hours as needed for migraine or headache. May repeat in 2 hours if headache persists or recurs.    Historical Provider, MD  traMADol (ULTRAM) 50 MG tablet Take 50 mg by mouth every 6 (six) hours as needed for moderate pain.    Historical Provider, MD  Vitamin D, Ergocalciferol, (DRISDOL) 50000 UNITS CAPS capsule Take 50,000  Units by mouth 2 (two) times a week. Takes on Sunday and Friday    Historical Provider, MD   BP 142/85  Pulse 82  Temp(Src) 98.1 F (36.7 C) (Oral)  Resp 20  Ht  (1.575 m)  Wt 126 lb (57.153 kg)  BMI 23.04 kg/m2  SpO2 100% Physical Exam  Nursing note and vitals reviewed. Constitutional: She is oriented to person, place, and time. She appears well-developed and well-nourished.  Non-toxic appearance. No distress.  HENT:  Head: Normocephalic and atraumatic.  Eyes: Conjunctivae, EOM and lids are normal. Pupils are equal, round, and reactive to light.  Neck: Normal range of motion. Neck supple. No tracheal deviation present. No mass present.  Cardiovascular: Normal rate, regular rhythm and normal heart sounds.  Exam reveals no gallop.   No murmur  heard. Pulmonary/Chest: Effort normal and breath sounds normal. No stridor. No respiratory distress. She has no decreased breath sounds. She has no wheezes. She has no rhonchi. She has no rales.  Abdominal: Soft. Normal appearance and bowel sounds are normal. She exhibits no distension. There is no tenderness. There is no rebound and no CVA tenderness.  Musculoskeletal: Normal range of motion. She exhibits no edema and no tenderness.       Arms: Neurological: She is alert and oriented to person, place, and time. She has normal strength. No cranial nerve deficit or sensory deficit. GCS eye subscore is 4. GCS verbal subscore is 5. GCS motor subscore is 6.  Skin: Skin is warm and dry. No abrasion and no rash noted.  Psychiatric: She has a normal mood and affect. Her speech is normal and behavior is normal.    ED Course  Procedures (including critical care time) Labs Review Labs Reviewed - No data to display  Imaging Review No results found.   EKG Interpretation None      MDM   Final diagnoses:  None    Patient given 2 oxycodone tablets here and encouraged to followup with her pain management Dr.    Toy Baker, MD 04/22/14 2206

## 2014-04-22 NOTE — Discharge Instructions (Signed)

## 2014-04-22 NOTE — ED Notes (Signed)
Pt c/o lower back pain that started earlier this evening; pt denies any injury

## 2014-06-05 ENCOUNTER — Encounter (HOSPITAL_COMMUNITY): Payer: Self-pay | Admitting: Emergency Medicine

## 2014-07-09 ENCOUNTER — Emergency Department (HOSPITAL_COMMUNITY)
Admission: EM | Admit: 2014-07-09 | Discharge: 2014-07-09 | Disposition: A | Discharge status: Home / Self Care | Payer: Medicare Other | Attending: Emergency Medicine | Admitting: Emergency Medicine

## 2014-07-09 ENCOUNTER — Encounter (HOSPITAL_COMMUNITY): Payer: Self-pay | Admitting: Emergency Medicine

## 2014-07-09 DIAGNOSIS — Z79899 Other long term (current) drug therapy: Secondary | ICD-10-CM | POA: Insufficient documentation

## 2014-07-09 DIAGNOSIS — M545 Low back pain, unspecified: Secondary | ICD-10-CM

## 2014-07-09 DIAGNOSIS — I10 Essential (primary) hypertension: Secondary | ICD-10-CM | POA: Insufficient documentation

## 2014-07-09 DIAGNOSIS — F419 Anxiety disorder, unspecified: Secondary | ICD-10-CM | POA: Diagnosis not present

## 2014-07-09 DIAGNOSIS — Z8719 Personal history of other diseases of the digestive system: Secondary | ICD-10-CM | POA: Diagnosis not present

## 2014-07-09 DIAGNOSIS — Z791 Long term (current) use of non-steroidal anti-inflammatories (NSAID): Secondary | ICD-10-CM | POA: Diagnosis not present

## 2014-07-09 DIAGNOSIS — G8929 Other chronic pain: Secondary | ICD-10-CM | POA: Insufficient documentation

## 2014-07-09 DIAGNOSIS — G2581 Restless legs syndrome: Secondary | ICD-10-CM | POA: Insufficient documentation

## 2014-07-09 DIAGNOSIS — M62830 Muscle spasm of back: Secondary | ICD-10-CM | POA: Insufficient documentation

## 2014-07-09 DIAGNOSIS — F329 Major depressive disorder, single episode, unspecified: Secondary | ICD-10-CM | POA: Diagnosis not present

## 2014-07-09 MED ORDER — OXYCODONE-ACETAMINOPHEN 5-325 MG PO TABS: 2.0000 | ORAL_TABLET | ORAL | Status: DC | PRN

## 2014-07-09 NOTE — ED Notes (Signed)
Signature system not working. System downtime.
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
## 2014-07-09 NOTE — ED Notes (Signed)
Pt reports low back pain x3 days. Pt reports chronic issue and is in a pain clinic but reports "pain will not stop." pt denies any new or recent injury.

## 2014-08-22 ENCOUNTER — Emergency Department (HOSPITAL_COMMUNITY)
Admission: EM | Admit: 2014-08-22 | Discharge: 2014-08-22 | Disposition: A | Discharge status: Home / Self Care | Payer: Medicare Other | Attending: Emergency Medicine | Admitting: Emergency Medicine

## 2014-08-22 ENCOUNTER — Encounter (HOSPITAL_COMMUNITY): Payer: Self-pay | Admitting: Emergency Medicine

## 2014-08-22 DIAGNOSIS — M5416 Radiculopathy, lumbar region: Secondary | ICD-10-CM | POA: Insufficient documentation

## 2014-08-22 DIAGNOSIS — I10 Essential (primary) hypertension: Secondary | ICD-10-CM | POA: Insufficient documentation

## 2014-08-22 DIAGNOSIS — G8929 Other chronic pain: Secondary | ICD-10-CM | POA: Diagnosis not present

## 2014-08-22 DIAGNOSIS — Z791 Long term (current) use of non-steroidal anti-inflammatories (NSAID): Secondary | ICD-10-CM | POA: Diagnosis not present

## 2014-08-22 DIAGNOSIS — Z8719 Personal history of other diseases of the digestive system: Secondary | ICD-10-CM | POA: Insufficient documentation

## 2014-08-22 DIAGNOSIS — M545 Low back pain: Secondary | ICD-10-CM | POA: Diagnosis present

## 2014-08-22 DIAGNOSIS — F329 Major depressive disorder, single episode, unspecified: Secondary | ICD-10-CM | POA: Insufficient documentation

## 2014-08-22 DIAGNOSIS — F419 Anxiety disorder, unspecified: Secondary | ICD-10-CM | POA: Insufficient documentation

## 2014-08-22 DIAGNOSIS — Z79899 Other long term (current) drug therapy: Secondary | ICD-10-CM | POA: Diagnosis not present

## 2014-08-22 MED ORDER — OXYCODONE-ACETAMINOPHEN 5-325 MG PO TABS: 1.0000 | ORAL_TABLET | ORAL | Status: DC | PRN

## 2014-08-22 NOTE — ED Provider Notes (Signed)
CSN: [id number]     Arrival date & time 08/22/14  1939 History   None    Chief Complaint  Patient presents with  . Hypertension  . Back Pain     (Consider location/radiation/quality/duration/timing/severity/associated sxs/prior Treatment) HPI  Ruth Rose is a 49 y.o. female who presents to the Emergency Department complaining of worsening of her chronic low back pain for several days and elevated blood pressure.  She states that she used to take BP medicine, but her BP improved and her PMD stopped it.  She states her BP at home was 150/93 taken with a BP cuff.  She denies headaches, cheat pain, shortness of breath, dizziness or visual changes.  She believes her elevated BP may be related to her level of pain.  She also denies urine or bladder changes, numbness or weakness of her lower extremities.     Past Medical History  Diagnosis Date  . Chronic back pain   . Hypertension   . Depression   . Anxiety   . Acid reflux   . Restless leg syndrome   . Fibromyalgia    Past Surgical History  Procedure Laterality Date  . Tubal ligation    . Ablasion     Family History  Problem Relation Age of Onset  . Cancer Mother   . Cancer Father   . Seizures Father   . Cancer Other   . Heart failure Other    History  Substance Use Topics  . Smoking status: Never Smoker   . Smokeless tobacco: Not on file  . Alcohol Use: No   OB History    Gravida Para Term Preterm AB TAB SAB Ectopic Multiple Living   Review of Systems  Constitutional: Negative for fever.  Respiratory: Negative for shortness of breath.   Gastrointestinal: Negative for vomiting, abdominal pain and constipation.  Genitourinary: Negative for dysuria, hematuria, flank pain, decreased urine volume and difficulty urinating.  Musculoskeletal: Positive for back pain. Negative for joint swelling.  Skin: Negative for rash.  Neurological: Negative for weakness and numbness.  All other systems reviewed  and are negative.     Allergies  Review of patient's allergies indicates no known allergies.  Home Medications   Prior to Admission medications   Medication Sig Start Date End Date Taking? Authorizing Provider  ALPRAZolam Prudy Feeler) 1 MG tablet Take 1 mg by mouth 3 (three) times daily.    Historical Provider, MD  amitriptyline (ELAVIL) 10 MG tablet Take 10 mg by mouth at bedtime.    Historical Provider, MD  cetirizine (ZYRTEC) 10 MG tablet Take 10 mg by mouth daily.    Historical Provider, MD  diclofenac (FLECTOR) 1.3 % PTCH Place 1 patch onto the skin daily.    Historical Provider, MD  gabapentin (NEURONTIN) 100 MG capsule Take 200 mg by mouth at bedtime.    Historical Provider, MD  Gabapentin, PHN, (GRALISE) 600 MG TABS Take 1 tablet by mouth 3 (three) times daily.    Historical Provider, MD  HYDROcodone-acetaminophen (NORCO/VICODIN) 5-325 MG per tablet Take 1 tablet by mouth every 4 (four) hours as needed for pain. 11/27/12   Burgess Amor, PA-C  Milnacipran (SAVELLA) 50 MG TABS tablet Take 50 mg by mouth daily.    Historical Provider, MD  oxyCODONE-acetaminophen (PERCOCET/ROXICET) 5-325 MG per tablet Take 2 tablets by mouth every 4 (four) hours as needed for moderate pain or severe pain. 07/09/14  Hope Orlene OchM Neese, NP  rOPINIRole (REQUIP) 0.25 MG tablet Take 0.25 mg by mouth at bedtime.     Historical Provider, MD  SUMAtriptan (IMITREX) 50 MG tablet Take 50 mg by mouth every 2 (two) hours as needed for migraine or headache. May repeat in 2 hours if headache persists or recurs.    Historical Provider, MD  traMADol (ULTRAM) 50 MG tablet Take 50 mg by mouth every 6 (six) hours as needed for moderate pain.    Historical Provider, MD  Vitamin D, Ergocalciferol, (DRISDOL) 50000 UNITS CAPS capsule Take 50,000 Units by mouth 2 (two) times a week. Takes on Sunday and Friday    Historical Provider, MD   BP 129/87 mmHg  Pulse 83  Temp(Src) 98.2 F (36.8 C) (Oral)  Resp 18  Ht 5\' 2"  (1.575 m)  Wt 126 lb  (57.153 kg)  BMI 23.04 kg/m2  SpO2 98% Physical Exam  Constitutional: She is oriented to person, place, and time. She appears well-developed and well-nourished. No distress.  HENT:  Head: Normocephalic and atraumatic.  Neck: Normal range of motion. Neck supple.  Cardiovascular: Normal rate, regular rhythm, normal heart sounds and intact distal pulses.   No murmur heard. Pulmonary/Chest: Effort normal and breath sounds normal. No respiratory distress. She exhibits no tenderness.  Abdominal: Soft. She exhibits no distension. There is no tenderness.  Musculoskeletal: She exhibits tenderness. She exhibits no edema.       Lumbar back: She exhibits tenderness and pain. She exhibits normal range of motion, no swelling, no deformity, no laceration and normal pulse.  Diffuse ttp of the lumbar paraspinal muscles and bilateral SI joints  No spinal tenderness.  DP pulses are brisk and symmetrical.  Distal sensation intact.  Hip Flexors/Extensors are intact.  Pt has 5/5 strength against resistance of bilateral lower extremities.     Neurological: She is alert and oriented to person, place, and time. She has normal strength. No sensory deficit. She exhibits normal muscle tone. Coordination and gait normal.  Reflex Scores:      Patellar reflexes are 2+ on the right side and 2+ on the left side.      Achilles reflexes are 2+ on the right side and 2+ on the left side. Skin: Skin is warm and dry. No rash noted.  Nursing note and vitals reviewed.   ED Course  Procedures (including critical care time) Labs Review Labs Reviewed - No data to display  Imaging Review No results found.   EKG Interpretation None      MDM   Final diagnoses:  Lumbar radicular pain    Pt reviewed on the [place] narcotic database.  Receiving narcotics and tramadol from Dr. Gerilyn Pilgrimoonquah   Pt is well appearing, ambulates with a  Steady gait.  No concerning sx's for emergent neurological process.  Pt agrees to arrange close f/u  with her PMD or pain management. BP is wnml and antihpertensives are not indicated at this time.   Senay Sistrunk L. Trisha Mangleriplett, PA-C 08/25/14 0037  Benny LennertJoseph L Zammit, MD 08/25/14 [phone]

## 2014-08-22 NOTE — ED Notes (Signed)
Pt states she noticed that her BP has been high ever since yesterday . States her Bp was 150/93 at home. She states she was on BP meds but that they took her off them. As for the back pain, it is chronic and is managed by pain clinic.

## 2014-08-22 NOTE — Discharge Instructions (Signed)

## 2014-08-29 MED FILL — Oxycodone w/ Acetaminophen Tab 5-325 MG: ORAL | Qty: 6 | Status: AC

## 2014-10-15 ENCOUNTER — Emergency Department (HOSPITAL_COMMUNITY)
Admission: EM | Admit: 2014-10-15 | Discharge: 2014-10-15 | Disposition: A | Discharge status: Home / Self Care | Payer: Medicare Other | Attending: Emergency Medicine | Admitting: Emergency Medicine

## 2014-10-15 ENCOUNTER — Encounter (HOSPITAL_COMMUNITY): Payer: Self-pay | Admitting: Emergency Medicine

## 2014-10-15 DIAGNOSIS — Z79899 Other long term (current) drug therapy: Secondary | ICD-10-CM | POA: Diagnosis not present

## 2014-10-15 DIAGNOSIS — F329 Major depressive disorder, single episode, unspecified: Secondary | ICD-10-CM | POA: Insufficient documentation

## 2014-10-15 DIAGNOSIS — J029 Acute pharyngitis, unspecified: Secondary | ICD-10-CM | POA: Insufficient documentation

## 2014-10-15 DIAGNOSIS — F419 Anxiety disorder, unspecified: Secondary | ICD-10-CM | POA: Diagnosis not present

## 2014-10-15 DIAGNOSIS — B370 Candidal stomatitis: Secondary | ICD-10-CM | POA: Insufficient documentation

## 2014-10-15 DIAGNOSIS — M797 Fibromyalgia: Secondary | ICD-10-CM | POA: Diagnosis not present

## 2014-10-15 DIAGNOSIS — G8929 Other chronic pain: Secondary | ICD-10-CM | POA: Insufficient documentation

## 2014-10-15 DIAGNOSIS — G2581 Restless legs syndrome: Secondary | ICD-10-CM | POA: Diagnosis not present

## 2014-10-15 DIAGNOSIS — K1379 Other lesions of oral mucosa: Secondary | ICD-10-CM | POA: Diagnosis present

## 2014-10-15 DIAGNOSIS — I10 Essential (primary) hypertension: Secondary | ICD-10-CM | POA: Diagnosis not present

## 2014-10-15 LAB — CBG MONITORING, ED: Glucose-Capillary: 114 mg/dL — ABNORMAL HIGH (ref 70–99)

## 2014-10-15 MED ORDER — NYSTATIN 100000 UNIT/ML MT SUSP: 500000.0000 [IU] | Freq: Four times a day (QID) | OROMUCOSAL | Status: DC

## 2014-10-15 MED ORDER — NYSTATIN 100000 UNIT/ML MT SUSP
5.0000 mL | Freq: Once | OROMUCOSAL | Status: AC
  Administered 2014-10-15: 500000 [IU] via OROMUCOSAL
  Filled 2014-10-15: qty 5

## 2014-10-15 NOTE — ED Notes (Signed)
Pt c/o "white blisters on my gums". [facility] and was given Amoxicillin for pharyngitis and told to follow up with the ENT doctor regarding her tonsils.

## 2014-10-15 NOTE — Discharge Instructions (Signed)
Thrush, Adult  Thrush, also called oral candidiasis, is a fungal infection that develops in the mouth and throat and on the tongue. It causes white patches to form on the mouth and tongue. Thrush is most common in older adults, but it can occur at any age.  Many cases of thrush are mild, but this infection can also be more serious. Thrush can be a recurring problem for people who have chronic illnesses or who take medicines that limit the body's ability to fight infection. Because these people have difficulty fighting infections, the fungus that causes thrush can spread throughout the body. This can cause life-threatening blood or organ infections. CAUSES  Thrush is usually caused by a yeast called Candida albicans. This fungus is normally present in small amounts in the mouth and on other mucous membranes. It usually causes no harm. However, when conditions are present that allow the fungus to grow uncontrolled, it invades surrounding tissues and becomes an infection. Less often, other Candida species can also lead to thrush.  RISK FACTORS Thrush is more likely to develop in the following people:  People with an impaired ability to fight infection (weakened immune system).   Older adults.   People with HIV.   People with diabetes.   People with dry mouth (xerostomia).   Pregnant women.   People with poor dental care, especially those who have false teeth.   People who use antibiotic medicines.  SIGNS AND SYMPTOMS  Thrush can be a mild infection that causes no symptoms. If symptoms develop, they may include:   A burning feeling in the mouth and throat. This can occur at the start of a thrush infection.   White patches that adhere to the mouth and tongue. The tissue around the patches may be red, raw, and painful. If rubbed (during tooth brushing, for example), the patches and the tissue of the mouth may bleed easily.   A bad taste in the mouth or difficulty tasting foods.    Cottony feeling in the mouth.   Pain during eating and swallowing. DIAGNOSIS  Your health care provider can usually diagnose thrush by looking in your mouth and asking you questions about your health.  TREATMENT  Medicines that help prevent the growth of fungi (antifungals) are the standard treatment for thrush. These medicines are either applied directly to the affected area (topical) or swallowed (oral). The treatment will depend on the severity of the condition.  Mild Thrush Mild cases of thrush may clear up with the use of an antifungal mouth rinse or lozenges. Treatment usually lasts about 14 days.  Moderate to Severe Thrush  More severe thrush infections that have spread to the esophagus are treated with an oral antifungal medicine. A topical antifungal medicine may also be used.   For some severe infections, a treatment period longer than 14 days may be needed.   Oral antifungal medicines are almost never used during pregnancy because the fetus may be harmed. However, if a pregnant woman has a rare, severe thrush infection that has spread to her blood, oral antifungal medicines may be used. In this case, the risk of harm to the mother and fetus from the severe thrush infection may be greater than the risk posed by the use of antifungal medicines.  Persistent or Recurrent Thrush For cases of thrush that do not go away or keep coming back, treatment may involve the following:   Treatment may be needed twice as long as the symptoms last.   Treatment will   include both oral and topical antifungal medicines.   People with weakened immune systems can take an antifungal medicine on a continuous basis to prevent thrush infections.  It is important to treat conditions that make you more likely to get thrush, such as diabetes or HIV.  HOME CARE INSTRUCTIONS   Only take over-the-counter or prescription medicine as directed by your health care provider. Talk to your health care  provider about an over-the-counter medicine called gentian violet, which kills bacteria and fungi.   Eat plain, unflavored yogurt as directed by your health care provider. Check the label to make sure the yogurt contains live cultures. This yogurt can help healthy bacteria grow in the mouth that can stop the growth of the fungus that causes thrush.   Try these measures to help reduce the discomfort of thrush:   Drink cold liquids such as water or iced tea.   Try flavored ice treats or frozen juices.   Eat foods that are easy to swallow, such as gelatin, ice cream, or custard.   If the patches in your mouth are painful, try drinking from a straw.   Rinse your mouth several times a day with a warm saltwater rinse. You can make the saltwater mixture with 1 tsp (6 g) of salt in 8 fl oz (0.2 L) of warm water.   If you wear dentures, remove the dentures before going to bed, brush them vigorously, and soak them in a cleaning solution as directed by your health care provider.   Women who are breastfeeding should clean their nipples with an antifungal medicine as directed by their health care provider. Dry the nipples after breastfeeding. Applying lanolin-containing body lotion may help relieve nipple soreness.  SEEK MEDICAL CARE IF:  Your symptoms are getting worse or are not improving within 7 days of starting treatment.   You have symptoms of spreading infection, such as white patches on the skin outside of the mouth.   You are nursing and you have redness, burning, or pain in the nipples that is not relieved with treatment.  MAKE SURE YOU:  Understand these instructions.  Will watch your condition.  Will get help right away if you are not doing well or get worse. Document Released: 04/15/2004 Document Revised: 05/11/2013 Document Reviewed: 02/21/2013 [employer] Patient Information 2015 [employer]. This information is not intended to replace advice given to you by your  health care provider. Make sure you discuss any questions you have with your health care provider.  

## 2014-10-17 NOTE — ED Provider Notes (Signed)
CSN: [id number]     Arrival date & time 10/15/14  1936 History   First MD Initiated Contact with Patient 10/15/14 2007     Chief Complaint  Patient presents with  . Mouth Lesions     (Consider location/radiation/quality/duration/timing/severity/associated sxs/prior Treatment) The history is provided by the patient.   Ruth Rose is a 49 y.o. female with a history of acid reflux, htn and acute pharyngitis, currently on day 3 of amoxil, presenting with white patches and soreness of her gums and oral mucosa which she noticed this morning.  She states her throat is some improved since being on the antibiotic.  She denies any persistent fevers, denies nasal congestion, rhinorrhea, difficulty swallowing, neck pain or stiffness.  She denies personal history of DM, but strong family history and states she has noticed increased thirst this week but no urinary frequency.      Past Medical History  Diagnosis Date  . Chronic back pain   . Hypertension   . Depression   . Anxiety   . Acid reflux   . Restless leg syndrome   . Fibromyalgia    Past Surgical History  Procedure Laterality Date  . Tubal ligation    . Ablasion     Family History  Problem Relation Age of Onset  . Cancer Mother   . Cancer Father   . Seizures Father   . Cancer Other   . Heart failure Other    History  Substance Use Topics  . Smoking status: Never Smoker   . Smokeless tobacco: Not on file  . Alcohol Use: No   OB History    Gravida Para Term Preterm AB TAB SAB Ectopic Multiple Living   9 6 6  3  3         Review of Systems  Constitutional: Negative for fever and chills.  HENT: Positive for mouth sores and sore throat. Negative for congestion, ear pain, rhinorrhea, sinus pressure, trouble swallowing and voice change.   Eyes: Negative for discharge.  Respiratory: Negative for cough, shortness of breath, wheezing and stridor.   Cardiovascular: Negative for chest pain.  Gastrointestinal: Negative for  abdominal pain.  Endocrine: Positive for polydipsia and polyuria. Negative for polyphagia.  Genitourinary: Negative.       Allergies  Review of patient's allergies indicates no known allergies.  Home Medications   Prior to Admission medications   Medication Sig Start Date End Date Taking? Authorizing Provider  amitriptyline (ELAVIL) 10 MG tablet Take 10 mg by mouth at bedtime.   Yes Historical Provider, MD  amoxicillin (AMOXIL) 500 MG tablet Take 500 mg by mouth 3 (three) times daily.   Yes Historical Provider, MD  cetirizine (ZYRTEC) 10 MG tablet Take 10 mg by mouth daily.   Yes Historical Provider, MD  diazepam (VALIUM) 10 MG tablet Take 10 mg by mouth 3 (three) times daily.   Yes Historical Provider, MD  gabapentin (NEURONTIN) 100 MG capsule Take 200 mg by mouth at bedtime.   Yes Historical Provider, MD  Gabapentin, PHN, (GRALISE) 600 MG TABS Take 1 tablet by mouth 3 (three) times daily.   Yes Historical Provider, MD  Milnacipran (SAVELLA) 50 MG TABS tablet Take 50 mg by mouth 2 (two) times daily.    Yes Historical Provider, MD  oxyCODONE-acetaminophen (PERCOCET/ROXICET) 5-325 MG per tablet Take 1 tablet by mouth every 4 (four) hours as needed. Patient taking differently: Take 1 tablet by mouth every 12 (twelve) hours.  08/22/14  Yes Tammi Triplett, PA-C  rOPINIRole (REQUIP) 0.25 MG tablet Take 0.25 mg by mouth at bedtime.    Yes Historical Provider, MD  SUMAtriptan (IMITREX) 50 MG tablet Take 50 mg by mouth every 2 (two) hours as needed for migraine or headache. May repeat in 2 hours if headache persists or recurs.   Yes Historical Provider, MD  traMADol (ULTRAM) 50 MG tablet Take 50 mg by mouth every 6 (six) hours as needed for moderate pain.   Yes Historical Provider, MD  Vitamin D, Ergocalciferol, (DRISDOL) 50000 UNITS CAPS capsule Take 50,000 Units by mouth 2 (two) times a week. Takes on Sunday and Friday   Yes Historical Provider, MD  HYDROcodone-acetaminophen (NORCO/VICODIN) 5-325  MG per tablet Take 1 tablet by mouth every 4 (four) hours as needed for pain. Patient not taking: Reported on 10/15/2014 11/27/12   Burgess Amor, PA-C  nystatin (MYCOSTATIN) 100000 UNIT/ML suspension Take 5 mLs (500,000 Units total) by mouth 4 (four) times daily. 10/15/14   Burgess Amor, PA-C  oxyCODONE-acetaminophen (PERCOCET/ROXICET) 5-325 MG per tablet Take 1 tablet by mouth every 4 (four) hours as needed. Patient not taking: Reported on 10/15/2014 08/22/14   Tammi Triplett, PA-C   BP 128/89 mmHg  Pulse 92  Temp(Src) 98.6 F (37 C) (Oral)  Resp 20  Ht  (1.575 m)  Wt 134 lb (60.782 kg)  BMI 24.50 kg/m2  SpO2 98% Physical Exam  Constitutional: She is oriented to person, place, and time. She appears well-developed and well-nourished.  HENT:  Head: Normocephalic and atraumatic.  Right Ear: Tympanic membrane and ear canal normal.  Left Ear: Tympanic membrane and ear canal normal.  Nose: No mucosal edema or rhinorrhea.  Mouth/Throat: Uvula is midline. Mucous membranes are not dry. No trismus in the jaw. No uvula swelling. Oropharyngeal exudate present. No posterior oropharyngeal edema, posterior oropharyngeal erythema or tonsillar abscesses.  Discreet scattered small white patches on buccal mucosa bilaterally which are not adherent, reddened mucosa beneath the patches.  Tonsils are mildly hypertrophied, no posterior pharyngeal exudate.   Eyes: Conjunctivae are normal.  Cardiovascular: Normal rate and normal heart sounds.   Pulmonary/Chest: Effort normal. No respiratory distress. She has no wheezes. She has no rales.  Abdominal: Soft. There is no tenderness.  Musculoskeletal: Normal range of motion.  Lymphadenopathy:    She has no cervical adenopathy.  Neurological: She is alert and oriented to person, place, and time.  Skin: Skin is warm and dry. No rash noted.  Psychiatric: She has a normal mood and affect.    ED Course  Procedures (including critical care time) Labs Review Labs  Reviewed  CBG MONITORING, ED - Abnormal; Notable for the following:    Glucose-Capillary 114 (*)    All other components within normal limits    Imaging Review No results found.   EKG Interpretation None      MDM   Final diagnoses:  Thrush, oral    Thrush, probably induced by abx.  cbg normal.  Pt advised to compete abx, added nystatin swish and swallow. F/u with pcp for recheck this week.  The patient appears reasonably screened and/or stabilized for discharge and I doubt any other medical condition or other [facility] requiring further screening, evaluation, or treatment in the ED at this time prior to discharge.     Burgess Amor, PA-C 10/17/14 [id number]  Doug Sou, MD 10/18/14 1336

## 2014-10-31 ENCOUNTER — Emergency Department (HOSPITAL_COMMUNITY): Payer: Medicare Other

## 2014-10-31 ENCOUNTER — Encounter (HOSPITAL_COMMUNITY): Payer: Self-pay | Admitting: Emergency Medicine

## 2014-10-31 ENCOUNTER — Emergency Department (HOSPITAL_COMMUNITY)
Admission: EM | Admit: 2014-10-31 | Discharge: 2014-10-31 | Disposition: A | Discharge status: Home / Self Care | Payer: Medicare Other | Attending: Emergency Medicine | Admitting: Emergency Medicine

## 2014-10-31 DIAGNOSIS — G8929 Other chronic pain: Secondary | ICD-10-CM | POA: Diagnosis not present

## 2014-10-31 DIAGNOSIS — Z79899 Other long term (current) drug therapy: Secondary | ICD-10-CM | POA: Diagnosis not present

## 2014-10-31 DIAGNOSIS — F419 Anxiety disorder, unspecified: Secondary | ICD-10-CM | POA: Diagnosis not present

## 2014-10-31 DIAGNOSIS — M25532 Pain in left wrist: Secondary | ICD-10-CM | POA: Diagnosis present

## 2014-10-31 DIAGNOSIS — F329 Major depressive disorder, single episode, unspecified: Secondary | ICD-10-CM | POA: Diagnosis not present

## 2014-10-31 DIAGNOSIS — Z8669 Personal history of other diseases of the nervous system and sense organs: Secondary | ICD-10-CM | POA: Insufficient documentation

## 2014-10-31 DIAGNOSIS — X58XXXA Exposure to other specified factors, initial encounter: Secondary | ICD-10-CM | POA: Insufficient documentation

## 2014-10-31 DIAGNOSIS — I10 Essential (primary) hypertension: Secondary | ICD-10-CM | POA: Insufficient documentation

## 2014-10-31 DIAGNOSIS — Y9289 Other specified places as the place of occurrence of the external cause: Secondary | ICD-10-CM | POA: Diagnosis not present

## 2014-10-31 DIAGNOSIS — Y998 Other external cause status: Secondary | ICD-10-CM | POA: Insufficient documentation

## 2014-10-31 DIAGNOSIS — Z8719 Personal history of other diseases of the digestive system: Secondary | ICD-10-CM | POA: Insufficient documentation

## 2014-10-31 DIAGNOSIS — Z792 Long term (current) use of antibiotics: Secondary | ICD-10-CM | POA: Insufficient documentation

## 2014-10-31 DIAGNOSIS — Y9389 Activity, other specified: Secondary | ICD-10-CM | POA: Insufficient documentation

## 2014-10-31 DIAGNOSIS — S63502A Unspecified sprain of left wrist, initial encounter: Secondary | ICD-10-CM | POA: Insufficient documentation

## 2014-10-31 MED ORDER — IBUPROFEN 600 MG PO TABS: 600.0000 mg | ORAL_TABLET | Freq: Four times a day (QID) | ORAL | Status: DC | PRN

## 2014-10-31 MED ORDER — IBUPROFEN 800 MG PO TABS
800.0000 mg | ORAL_TABLET | Freq: Once | ORAL | Status: AC
  Administered 2014-10-31: 800 mg via ORAL
  Filled 2014-10-31: qty 1

## 2014-10-31 NOTE — ED Provider Notes (Signed)
CSN: [id number]     Arrival date & time 10/31/14  1727 History   First MD Initiated Contact with Patient 10/31/14 1807     Chief Complaint  Patient presents with  . Wrist Pain     (Consider location/radiation/quality/duration/timing/severity/associated sxs/prior Treatment) The history is provided by the patient.   Ruth Rose is a 49 y.o. 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  Yesterday she caught her hand in the steering wheel while driving and reports increased sharp pain with flex/ext of the wrist along with burning into her digits. She has used oxycodone which she takes occasionally for chronic pain which was not helpful.  She is concerned for possible fracture.       Past Medical History  Diagnosis Date  . Chronic back pain   . Hypertension   . Depression   . Anxiety   . Acid reflux   . Restless leg syndrome   . Fibromyalgia    Past Surgical History  Procedure Laterality Date  . Tubal ligation    . Ablasion     Family History  Problem Relation Age of Onset  . Cancer Mother   . Cancer Father   . Seizures Father   . Cancer Other   . Heart failure Other    History  Substance Use Topics  . Smoking status: Never Smoker   . Smokeless tobacco: Not on file  . Alcohol Use: No   OB History    Gravida Para Term Preterm AB TAB SAB Ectopic Multiple Living   Review of Systems  Constitutional: Negative for fever.  Musculoskeletal: Positive for arthralgias. Negative for myalgias and joint swelling.  Neurological: Negative for weakness and numbness.      Allergies  Review of patient's allergies indicates no known allergies.  Home Medications   Prior to Admission medications   Medication Sig Start Date End Date Taking? Authorizing Provider  amitriptyline (ELAVIL) 10 MG tablet Take  10 mg by mouth at bedtime.    Historical Provider, MD  amoxicillin (AMOXIL) 500 MG tablet Take 500 mg by mouth 3 (three) times daily.    Historical Provider, MD  cetirizine (ZYRTEC) 10 MG tablet Take 10 mg by mouth daily.    Historical Provider, MD  diazepam (VALIUM) 10 MG tablet Take 10 mg by mouth 3 (three) times daily.    Historical Provider, MD  gabapentin (NEURONTIN) 100 MG capsule Take 200 mg by mouth at bedtime.    Historical Provider, MD  Gabapentin, PHN, (GRALISE) 600 MG TABS Take 1 tablet by mouth 3 (three) times daily.    Historical Provider, MD  HYDROcodone-acetaminophen (NORCO/VICODIN) 5-325 MG per tablet Take 1 tablet by mouth every 4 (four) hours as needed for pain. Patient not taking: Reported on 10/15/2014 11/27/12   Burgess Amor, PA-C  ibuprofen (ADVIL,MOTRIN) 600 MG tablet Take 1 tablet (600 mg total) by mouth every 6 (six) hours as needed. 10/31/14   Burgess Amor, PA-C  Milnacipran (SAVELLA) 50 MG TABS tablet Take 50 mg by mouth 2 (two) times daily.     Historical Provider, MD  nystatin (MYCOSTATIN) 100000 UNIT/ML suspension Take 5 mLs (500,000 Units total) by mouth 4 (four) times daily. 10/15/14   Burgess Amor, PA-C  oxyCODONE-acetaminophen (PERCOCET/ROXICET) 5-325 MG per tablet Take  1 tablet by mouth every 4 (four) hours as needed. Patient not taking: Reported on 10/15/2014 08/22/14   Tammi Triplett, PA-C  oxyCODONE-acetaminophen (PERCOCET/ROXICET) 5-325 MG per tablet Take 1 tablet by mouth every 4 (four) hours as needed. Patient taking differently: Take 1 tablet by mouth every 12 (twelve) hours.  08/22/14   Tammi Triplett, PA-C  rOPINIRole (REQUIP) 0.25 MG tablet Take 0.25 mg by mouth at bedtime.     Historical Provider, MD  SUMAtriptan (IMITREX) 50 MG tablet Take 50 mg by mouth every 2 (two) hours as needed for migraine or headache. May repeat in 2 hours if headache persists or recurs.    Historical Provider, MD  traMADol (ULTRAM) 50 MG tablet Take 50 mg by mouth every 6 (six) hours as  needed for moderate pain.    Historical Provider, MD  Vitamin D, Ergocalciferol, (DRISDOL) 50000 UNITS CAPS capsule Take 50,000 Units by mouth 2 (two) times a week. Takes on Sunday and Friday    Historical Provider, MD   BP 134/84 mmHg  Pulse 86  Temp(Src) 98.4 F (36.9 C) (Oral)  Resp 18  Ht 5\' 2"  (1.575 m)  Wt 130 lb (58.968 kg)  BMI 23.77 kg/m2  SpO2 100% Physical Exam  Constitutional: She appears well-developed and well-nourished.  HENT:  Head: Atraumatic.  Neck: Normal range of motion.  Cardiovascular:  Pulses equal bilaterally  Musculoskeletal: She exhibits tenderness.       Left wrist: She exhibits tenderness. She exhibits no swelling, no effusion, no crepitus and no deformity.       Left hand: She exhibits deformity. She exhibits normal range of motion, no tenderness, normal two-point discrimination, normal capillary refill and no swelling. Normal sensation noted.  Congenital deformity of digits.  Neurological: She is alert. She has normal strength. She displays normal reflexes. No sensory deficit.  Skin: Skin is warm and dry.  Psychiatric: She has a normal mood and affect.    ED Course  Procedures (including critical care time) Labs Review Labs Reviewed - No data to display  Imaging Review Dg Wrist Complete Left  10/31/2014   CLINICAL DATA:  Left wrist pain, caught in spokes of steering wheel  EXAM: LEFT WRIST - COMPLETE 3+ VIEW  COMPARISON:  None.  FINDINGS: Four views of the left wrist submitted. No acute fracture or subluxation. No radiopaque foreign body.  IMPRESSION: Negative.   Electronically Signed   By: Natasha MeadLiviu  Pop M.D.   On: 10/31/2014 18:09     EKG Interpretation None      MDM   Final diagnoses:  Wrist sprain, left, initial encounter    Patients labs and/or radiological studies were reviewed and considered during the medical decision making and disposition process.  Results were also discussed with patient. Pt placed in velcro wrist splint.  Encouraged rest, elevation, ice.  Ibuprofen prescribed. F/u with pcp if sx persist. Also referred to ortho for prn f/u.  The patient appears reasonably screened and/or stabilized for discharge and I doubt any other medical condition or other [facility] requiring further screening, evaluation, or treatment in the ED at this time prior to discharge.     Burgess AmorJulie Hezikiah Retzloff, PA-C 11/01/14 1149  Benjiman CoreNathan Pickering, MD 11/02/14 [phone]

## 2014-10-31 NOTE — ED Notes (Signed)
Pt presents to ED after injuring her left wrist in the car yesterday afternoon.  She states that her wrist may have gotten caught in the spokes of the steering wheel and gotten twisted and now hurts.  She rates her pain at an 8/10 and describes it as sharp, burning pain.  She has taken oxycontin last night but it did not relieve the pain in her wrist.  She has not taken anything for pain today.

## 2014-10-31 NOTE — Discharge Instructions (Signed)
Wrist Splint A wrist splint holds your wrist in a set position so that it does not move (fixed position). It can help broken bones and sprains heal faster, with less pain. It can also help relieve pressure on the nerve that runs down the middle of your arm (median nerve) into your fingers.  HOME CARE  Wear your splint as told by your doctor. It may be worn while you sleep.  Exercise your wrist as told by your doctor. These exercises help keep muscle strength in your hand and wrist. They also help to make sure you keep motion in your fingers. GET HELP RIGHT AWAY IF:   You start to lose feeling in your hand or fingers.  Your skin or fingernails turn blue or gray, or they feel cold. MAKE SURE YOU:   Understand these instructions.  Will watch your condition.  Will get help right away if you are not doing well or get worse. Document Released: 01/07/2008 Document Revised: 10/13/2011 Document Reviewed: 11/01/2013 [facility] Patient Information 2015 [employer] [place]. This information is not intended to replace advice given to you by your health care provider. Make sure you discuss any questions you have with your health care provider.  Wrist Sprain with Rehab A sprain is an injury in which a ligament that maintains the proper alignment of a joint is partially or completely torn. The ligaments of the wrist are susceptible to sprains. Sprains are classified into three categories. Grade 1 sprains cause pain, but the tendon is not lengthened. Grade 2 sprains include a lengthened ligament because the ligament is stretched or partially ruptured. With grade 2 sprains there is still function, although the function may be diminished. Grade 3 sprains are characterized by a complete tear of the tendon or muscle, and function is usually impaired. SYMPTOMS   Pain tenderness, inflammation, and/or bruising (contusion) of the injury.  A "pop" or tear felt and/or heard at the time of injury.  Decreased wrist  function. CAUSES  A wrist sprain occurs when a force is placed on one or more ligaments that is greater than it/they can withstand. Common mechanisms of injury include:  Catching a ball with you hands.  Repetitive and/ or strenuous extension or flexion of the wrist. RISK INCREASES WITH:  Previous wrist injury.  Contact sports (boxing or wrestling).  Activities in which falling is common.  Poor strength and flexibility.  Improperly fitted or padded protective equipment. PREVENTION  Warm up and stretch properly before activity.  Allow for adequate recovery between workouts.  Maintain physical fitness:  Strength, flexibility, and endurance.  Cardiovascular fitness.  Protect the wrist joint by limiting its motion with the use of taping, braces, or splints.  Protect the wrist after injury for 6 to 12 months. PROGNOSIS  The prognosis for wrist sprains depends on the degree of injury. Grade 1 sprains require 2 to 6 weeks of treatment. Grade 2 sprains require 6 to 8 weeks of treatment, and grade 3 sprains require up to 12 weeks.  RELATED COMPLICATIONS   Prolonged healing time, if improperly treated or re-injured.  Recurrent symptoms that result in a chronic problem.  Injury to nearby structures (bone, cartilage, nerves, or tendons).  Arthritis of the wrist.  Inability to compete in athletics at a high level.  Wrist stiffness or weakness.  Progression to a complete rupture of the ligament. TREATMENT  Treatment initially involves resting from any activities that aggravate the symptoms, and the use of ice and medications to help reduce pain and  inflammation. Your caregiver may recommend immobilizing the wrist for a period of time in order to reduce stress on the ligament and allow for healing. After immobilization it is important to perform strengthening and stretching exercises to help regain strength and a full range of motion. These exercises may be completed at home or  with a therapist. Surgery is not usually required for wrist sprains, unless the ligament has been ruptured (grade 3 sprain). MEDICATION   If pain medication is necessary, then nonsteroidal anti-inflammatory medications, such as aspirin and ibuprofen, or other minor pain relievers, such as acetaminophen, are often recommended.  Do not take pain medication for 7 days before surgery.  Prescription pain relievers may be given if deemed necessary by your caregiver. Use only as directed and only as much as you need. HEAT AND COLD  Cold treatment (icing) relieves pain and reduces inflammation. Cold treatment should be applied for 10 to 15 minutes every 2 to 3 hours for inflammation and pain and immediately after any activity that aggravates your symptoms. Use ice packs or massage the area with a piece of ice (ice massage).  Heat treatment may be used prior to performing the stretching and strengthening activities prescribed by your caregiver, physical therapist, or athletic trainer. Use a heat pack or soak your injury in warm water. SEEK MEDICAL CARE IF:  Treatment seems to offer no benefit, or the condition worsens.  Any medications produce adverse side effects.

## 2015-05-20 ENCOUNTER — Encounter (HOSPITAL_COMMUNITY): Payer: Self-pay | Admitting: Emergency Medicine

## 2015-05-20 ENCOUNTER — Emergency Department (HOSPITAL_COMMUNITY): Payer: Medicare Other

## 2015-05-20 ENCOUNTER — Emergency Department (HOSPITAL_COMMUNITY)
Admission: EM | Admit: 2015-05-20 | Discharge: 2015-05-20 | Disposition: A | Discharge status: Home / Self Care | Payer: Medicare Other | Attending: Emergency Medicine | Admitting: Emergency Medicine

## 2015-05-20 DIAGNOSIS — Z8719 Personal history of other diseases of the digestive system: Secondary | ICD-10-CM | POA: Diagnosis not present

## 2015-05-20 DIAGNOSIS — G2581 Restless legs syndrome: Secondary | ICD-10-CM | POA: Diagnosis not present

## 2015-05-20 DIAGNOSIS — R1084 Generalized abdominal pain: Secondary | ICD-10-CM | POA: Insufficient documentation

## 2015-05-20 DIAGNOSIS — Z9851 Tubal ligation status: Secondary | ICD-10-CM | POA: Insufficient documentation

## 2015-05-20 DIAGNOSIS — R197 Diarrhea, unspecified: Secondary | ICD-10-CM | POA: Diagnosis not present

## 2015-05-20 DIAGNOSIS — F329 Major depressive disorder, single episode, unspecified: Secondary | ICD-10-CM | POA: Diagnosis not present

## 2015-05-20 DIAGNOSIS — R112 Nausea with vomiting, unspecified: Secondary | ICD-10-CM | POA: Insufficient documentation

## 2015-05-20 DIAGNOSIS — F419 Anxiety disorder, unspecified: Secondary | ICD-10-CM | POA: Diagnosis not present

## 2015-05-20 DIAGNOSIS — I10 Essential (primary) hypertension: Secondary | ICD-10-CM | POA: Diagnosis not present

## 2015-05-20 DIAGNOSIS — R05 Cough: Secondary | ICD-10-CM | POA: Insufficient documentation

## 2015-05-20 DIAGNOSIS — G8929 Other chronic pain: Secondary | ICD-10-CM | POA: Diagnosis not present

## 2015-05-20 DIAGNOSIS — Z79899 Other long term (current) drug therapy: Secondary | ICD-10-CM | POA: Insufficient documentation

## 2015-05-20 DIAGNOSIS — R059 Cough, unspecified: Secondary | ICD-10-CM

## 2015-05-20 DIAGNOSIS — Z792 Long term (current) use of antibiotics: Secondary | ICD-10-CM | POA: Insufficient documentation

## 2015-05-20 DIAGNOSIS — M797 Fibromyalgia: Secondary | ICD-10-CM | POA: Insufficient documentation

## 2015-05-20 DIAGNOSIS — R109 Unspecified abdominal pain: Secondary | ICD-10-CM

## 2015-05-20 LAB — COMPREHENSIVE METABOLIC PANEL
ALK PHOS: 71 U/L (ref 38–126)
ALT: 37 U/L (ref 14–54)
AST: 33 U/L (ref 15–41)
Albumin: 4 g/dL (ref 3.5–5.0)
Anion gap: 7 (ref 5–15)
BUN: 7 mg/dL (ref 6–20)
CALCIUM: 9.3 mg/dL (ref 8.9–10.3)
CO2: 26 mmol/L (ref 22–32)
Chloride: 108 mmol/L (ref 101–111)
Creatinine, Ser: 0.71 mg/dL (ref 0.44–1.00)
GFR calc Af Amer: >60 mL/min (ref >60)
GFR calc non Af Amer: >60 mL/min (ref >60)
GLUCOSE: 106 mg/dL — AB (ref 65–99)
POTASSIUM: 3.1 mmol/L — AB (ref 3.5–5.1)
Sodium: 141 mmol/L (ref 135–145)
Total Bilirubin: 0.8 mg/dL (ref 0.3–1.2)
Total Protein: 6.9 g/dL (ref 6.5–8.1)

## 2015-05-20 LAB — CBC WITH DIFFERENTIAL/PLATELET
Basophils Absolute: 0 10*3/uL (ref 0.0–0.1)
Basophils Relative: 0 %
Eosinophils Absolute: 0 10*3/uL (ref 0.0–0.7)
Eosinophils Relative: 0 %
HEMATOCRIT: 41.4 % (ref 36.0–46.0)
HEMOGLOBIN: 13.8 g/dL (ref 12.0–15.0)
LYMPHS PCT: 36 %
Lymphs Abs: 3.4 10*3/uL (ref 0.7–4.0)
MCH: 31.4 pg (ref 26.0–34.0)
MCHC: 33.3 g/dL (ref 30.0–36.0)
MCV: 94.1 fL (ref 78.0–100.0)
MONOS PCT: 11 %
Monocytes Absolute: 1 10*3/uL (ref 0.1–1.0)
NEUTROS ABS: 4.9 10*3/uL (ref 1.7–7.7)
NEUTROS PCT: 53 %
Platelets: 225 10*3/uL (ref 150–400)
RBC: 4.4 MIL/uL (ref 3.87–5.11)
RDW: 13 % (ref 11.5–15.5)
WBC: 9.4 10*3/uL (ref 4.0–10.5)

## 2015-05-20 LAB — URINALYSIS, ROUTINE W REFLEX MICROSCOPIC
Bilirubin Urine: NEGATIVE
Glucose, UA: NEGATIVE mg/dL
Ketones, ur: NEGATIVE mg/dL
LEUKOCYTES UA: NEGATIVE
Nitrite: NEGATIVE
PH: 6 (ref 5.0–8.0)
Protein, ur: NEGATIVE mg/dL
Specific Gravity, Urine: 1.02 (ref 1.005–1.030)
Urobilinogen, UA: 0.2 mg/dL (ref 0.0–1.0)

## 2015-05-20 LAB — URINE MICROSCOPIC-ADD ON

## 2015-05-20 LAB — LIPASE, BLOOD: Lipase: 21 U/L — ABNORMAL LOW (ref 22–51)

## 2015-05-20 LAB — PREGNANCY, URINE: Preg Test, Ur: NEGATIVE

## 2015-05-20 MED ORDER — SODIUM CHLORIDE 0.9 % IV BOLUS (SEPSIS)
500.0000 mL | Freq: Once | INTRAVENOUS | Status: AC
Start: 2015-05-20 — End: 2015-05-20
  Administered 2015-05-20: 500 mL via INTRAVENOUS

## 2015-05-20 MED ORDER — POTASSIUM CHLORIDE CRYS ER 20 MEQ PO TBCR
40.0000 meq | EXTENDED_RELEASE_TABLET | Freq: Once | ORAL | Status: AC
  Administered 2015-05-20: 40 meq via ORAL
  Filled 2015-05-20: qty 2

## 2015-05-20 MED ORDER — IOHEXOL 300 MG/ML  SOLN
25.0000 mL | Freq: Once | INTRAMUSCULAR | Status: AC | PRN
  Administered 2015-05-20: 25 mL via ORAL

## 2015-05-20 MED ORDER — FAMOTIDINE IN NACL 20-0.9 MG/50ML-% IV SOLN
20.0000 mg | Freq: Once | INTRAVENOUS | Status: AC
  Administered 2015-05-20: 20 mg via INTRAVENOUS
  Filled 2015-05-20: qty 50

## 2015-05-20 MED ORDER — DICYCLOMINE HCL 10 MG/ML IM SOLN
20.0000 mg | Freq: Once | INTRAMUSCULAR | Status: AC
  Administered 2015-05-20: 20 mg via INTRAMUSCULAR
  Filled 2015-05-20: qty 2

## 2015-05-20 MED ORDER — ONDANSETRON HCL 4 MG/2ML IJ SOLN
4.0000 mg | INTRAMUSCULAR | Status: DC | PRN
  Administered 2015-05-20: 4 mg via INTRAVENOUS
  Filled 2015-05-20: qty 2

## 2015-05-20 MED ORDER — BENZONATATE 100 MG PO CAPS: 100.0000 mg | ORAL_CAPSULE | Freq: Three times a day (TID) | ORAL | Status: DC | PRN

## 2015-05-20 MED ORDER — IOHEXOL 300 MG/ML  SOLN
100.0000 mL | Freq: Once | INTRAMUSCULAR | Status: AC | PRN
  Administered 2015-05-20: 100 mL via INTRAVENOUS

## 2015-05-20 MED ORDER — SODIUM CHLORIDE 0.9 % IV SOLN
INTRAVENOUS | Status: DC
  Administered 2015-05-20: 16:00:00 via INTRAVENOUS
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
## 2015-05-20 NOTE — ED Notes (Signed)
Patient c/o right flank pain. Per patient was seen at urgent care a week ago for abd pain and diagnosed with URI and UTI. Patient reports being given antibiotics in which she stated continued to make "her sick on her stomach" so she seen her PCP. Patient received flu shot and injection for nausea. Patient denies any pain with urination but reports fevers and vomiting.

## 2015-05-20 NOTE — ED Notes (Signed)
Patient transported to CT 

## 2015-05-20 NOTE — Discharge Instructions (Signed)
°Emergency Department Resource Guide °1) Find a Doctor and Pay Out of Pocket °Although you won't have to find out who is covered by your insurance plan, it is a good idea to ask around and get recommendations. You will then need to call the office and see if the doctor you have chosen will accept you as a new patient and what types of options they offer for patients who are self-pay. Some doctors offer discounts or will set up payment plans for their patients who do not have insurance, but you will need to ask so you aren't surprised when you get to your appointment. ° °2) Contact Your Local Health Department °Not all health departments have doctors that can see patients for sick visits, but many do, so it is worth a call to see if yours does. If you don't know where your local health department is, you can check in your phone book. The CDC also has a tool to help you locate your state's health department, and many state websites also have listings of all of their local health departments. ° °3) Find a Walk-in Clinic °If your illness is not likely to be very severe or complicated, you may want to try a walk in clinic. These are popping up all over the country in pharmacies, drugstores, and shopping centers. They're usually staffed by nurse practitioners or physician assistants that have been trained to treat common illnesses and complaints. They're usually fairly quick and inexpensive. However, if you have serious medical issues or chronic medical problems, these are probably not your best option. ° °No Primary Care Doctor: °- Call [facility] at  832-8000 - they can help you locate a primary care doctor that  accepts your insurance, provides certain services, etc. °- Physician Referral Service- 1-800-533-3463 ° °Chronic Pain Problems: °Organization         Address  Phone   Notes  °[facility]  (336) 297-2271 Patients need to be referred by their primary care doctor.  ° °Medication  Assistance: °Organization         Address  Phone   Notes  °[place] [facility] [address] °[place], [place] [address] (336) 641-8030 --Must be a resident of [place] °-- Must have NO insurance coverage whatsoever (no Medicaid/ Medicare, etc.) °-- The pt. MUST have a primary care doctor that directs their care regularly and follows them in the community °  °[facility]  (866) 331-1348   °[employer]  (888) 892-1162   ° °Agencies that provide inexpensive medical care: °Organization         Address  Phone   Notes  °[facility]  (336) 832-8035   °[facility]    (336) 832-7272   °[facility] [address] °[place], [place] [address] (336) 832-4777   °[facility] [address], °[place] (336) 271-4999   °[facility]    (336) 373-0678   °[facility]    (336) 272-1050   °[facility] ° [address], [place] Phone:  (336) 832-4444, Fax:  (336) 832-4440 Hours of Operation:  9 am - 6 pm, M-F.  Also accepts Medicaid/Medicare and self-pay.  °[facility] ° [address], [place] Phone: (336) 832-3150, Fax: (336) [phone]. Hours of Operation:  8:30 am - 5:30 pm, M-F.  Also accepts Medicaid and self-pay.  °[facility] [place] [address], [place] Phone: (336) 878-6027   °[facility] [address], [place], [place] (336)723-1848, Ext. 123 Mondays & Thursdays: 7-9 AM.  First 15 patients are seen on a first come, first serve basis. °  ° °Medicaid-accepting [place] Providers: ° °Organization         Address  Phone   Notes  °[facility] [address], [place] (336) 641-2100 Also accepts self-pay patients.  °[facility] [address], [place] ° (336) 856-9996   °[facility] [address], [place]  (336) 288-8857   °[facility] [address] [place] [address], [place] (336) 299-7000   °Veita Bland [address], [place]  ° (336) 373-1557 Only accepts [place] Access Medicaid patients after they have their name applied to their card.  ° °Self-Pay (no insurance) in [place]: ° °Organization         Address  Phone   Notes  °Sickle Cell Patients, [facility] [address], [place] (336) 832-1970   °[facility] [address], [place] (336) 832-4400   °[facility] ° [address], [place] (336) 992-4800   °[facility]/Dr. Osei-Bonsu ° [address] [place] [address], [place] or [address], [place] (336) 841-8500 Phone number for both [place] and [place] locations is the same.  °[facility] [address], [place] (336) 299-0000   °[facility] [address] [place] [address], [place] or [address] (336) 852-7530 °(336) 878-2260   °[facility] [address], [place] (336) 350-1642, phone; (336) 294-5005, fax Sees patients 1st and 3rd Saturday of every month.  Must not qualify for public or private insurance (i.e. Medicaid, Medicare, [place] Health Choice, Veterans' Benefits) • Household income should be no more than 200% of the poverty level •The clinic cannot treat you if you are pregnant or think you are pregnant • Sexually transmitted diseases are not treated at the clinic.  ° ° °Dental Care: °Organization         Address  Phone  Notes  °[place] [facility] [address], [place] (336) 641-6152 Accepts children up to age 21 who are enrolled in Medicaid or [place] Health Choice; pregnant women with a Medicaid card; and children who have applied for Medicaid or [place] Health Choice, but were declined, whose parents can pay a reduced fee at time of service.  °[place]  [facility] [place]  [address], [place] (336) 641-7733 Accepts children up to age 21 who are enrolled in Medicaid or [place] Health Choice; pregnant women with a Medicaid card; and children who have applied for Medicaid or [place] Health Choice, but were declined, whose parents can pay a reduced fee at time of service.  °[facility] ° [address], [place] (336) 641-4533 Patients are seen by appointment only. Walk-ins are not accepted. [facility] will see patients 18 years of age and older. °Monday - Tuesday (8am-5pm) °Most Wednesdays (8:30-5pm) °$30 per visit, cash only  °[facility] ° [address], [place] (336) 641-4533 Patients are seen by appointment only. Walk-ins are not accepted. [facility] will see patients 18 years of age and older. °One   Wednesday Evening (Monthly: Volunteer Based).  $30 per visit, cash only  °[facility]  (919) 537-3737 for adults; Children under age 4, call Graduate Pediatric Dentistry at ([phone]) [phone]. Children aged 4-14, please call ([phone]) [phone] to request a pediatric application. ° Dental services are provided in all areas of dental care including fillings, crowns and bridges, complete and partial dentures, implants, gum treatment, root canals, and extractions. Preventive care is also provided. Treatment is provided to both adults and children. °Patients are selected via a lottery and there is often a waiting list. °  °[facility] [address], °[place] ° (336) 763-8833 [email or web address] °  °[facility] [address], [place], [place] (336)723-1848, Ext. 123 Second and Fourth Thursday of each month, opens at 6:30 AM; Clinic ends at 9 AM.  Patients are seen on a first-come first-served basis, and a limited number are seen during each clinic.  ° °[facility] ° [address], [place], [place] (336) 723-7904    Eligibility Requirements °You must have lived in [place], [place], or [place] counties for at least the last three months. °  You cannot be eligible for state or federal sponsored healthcare insurance, including Veterans Administration, Medicaid, or Medicare. °  You generally cannot be eligible for healthcare insurance through your employer.  °  How to apply: °Eligibility screenings are held every Tuesday and Wednesday afternoon from 1:00 pm until 4:00 pm. You do not need an appointment for the interview!  °[facility] [address], [place], [place] 336-631-2330   °[facility]  336-342-8273   °[facility]  336-703-3100   °[facility]  336-570-6415   ° °Behavioral Health Resources in the Community: °Intensive Outpatient Programs °Organization         Address  Phone  Notes  °[facility] [address], [place], [place] 336-878-6098   °[facility] [address], [place], [place] 336-832-9800   °[facility] [address], [place], [place] ° 336-882-2125   °[facility] [address],  °[place], [place] 1-800-853-5163 or 336-641-4981   °Substance Abuse Resources °Organization         Address  Phone  Notes  °[facility]  336-882-2125   °[facility]  336-784-9470   °[facility]  336-285-9073   °[facility]  336-845-3988   °Residential & Outpatient Substance Abuse Program  1-800-659-3381   °Psychological Services °Organization         Address  Phone  Notes  °[facility]  336- 832-9600   °[facility]  336- 378-7881   °[facility] [address], [place] 1-800-853-5163 or 336-641-4981   ° °Mobile Crisis Teams °Organization         Address  Phone  Notes  °[facility], Mobile Crisis Care Unit  1-877-626-1772   °Assertive °[facility] ° [address]  [place], [place] 336-834-9664   °Sharon DeEsch [address] °[place] 336-554-5454   ° °Self-Help/Support Groups °Organization         Address  Phone             Notes  °[facility]  - variety of support groups  336- 373-1402 Call for more information  °Narcotics Anonymous (NA), [facility] [address], °[place]  2 meetings at this location  ° °  Residential Treatment Programs Organization         Address  Phone  Notes  [facility] [address],    [place]  [phone]-([phone]) [phone]   [facility]  [address], [place] [id number], [place], [place] [phone]   [facility] [address], [place] [phone] Admissions: 8am-3pm M-F  [facility] [address]    [place], [place] [phone]   [facility] [address], [place], [place] [phone]   [facility] [address]  [place], [place] [phone]   [facility] Intensive Outpatient [address], [place], [place] [phone]   [facility] [address]  [place], [place] [phone] or [phone]   [facility] [address] [place], [place] [phone]) [phone] Accepts Medicaid  [facility] [address]  [place] [phone] Substance Abuse/Addiction Treatment   [facility]         Address  Phone  Notes  [facility]  579-218-6427(888) [phone]   Angie FavaJulie Brannon, PhD [address], [address] [place], [place]   [phone](336) [phone] or [phone](336) ([phone]   [facility]   [address] [place], [place] [phone](336) [phone]   [facility] [address], [place], [place] [phone](336) [phone] Insurance/Medicaid/sponsorship through [facility] [address]                                    [place], [place] [phone](336) [phone] Therapy/tele-psych/case    [facility] [address]   [place], [place] [phone](336) [phone]    Dr. Lolly MustacheArfeen  [phone](336) [phone]   [facility]  [facility] 1) [address], [place] 2) [address], [place] 3)  [address], [place] [phone](336) [phone](336) [phone]  [phone](336) [phone]   [facility] Child [facility] Hot[facility] (708)100-7321(336) 5347218460 or 519-114-8572(336) (510)[phone] (After Hours)      Take the prescription as directed.  Increase your fluid intake (ie:  Gatoraide) for the next few days.  Eat a bland diet and advance to your regular diet slowly as you can tolerate it.   Avoid full strength juices, as well as milk and milk products until your diarrhea has resolved. Take your usual prescriptions as previously directed.  Call your regular medical doctor on Monday to schedule a follow up appointment within the next 2 to 3 days.  Return to the Emergency Department immediately sooner if worsening.

## 2015-08-23 ENCOUNTER — Ambulatory Visit (INDEPENDENT_AMBULATORY_CARE_PROVIDER_SITE_OTHER): Payer: Medicare Other | Admitting: Obstetrics & Gynecology

## 2015-08-23 ENCOUNTER — Other Ambulatory Visit (HOSPITAL_COMMUNITY)
Admission: RE | Admit: 2015-08-23 | Discharge: 2015-08-23 | Disposition: A | Discharge status: Home / Self Care | Payer: Medicare Other | Source: Ambulatory Visit | Attending: Obstetrics & Gynecology | Admitting: Obstetrics & Gynecology

## 2015-08-23 ENCOUNTER — Encounter: Payer: Self-pay | Admitting: Obstetrics & Gynecology

## 2015-08-23 VITALS — BP 110/80 | HR 84 | Wt 124.0 lb

## 2015-08-23 DIAGNOSIS — Z1211 Encounter for screening for malignant neoplasm of colon: Secondary | ICD-10-CM

## 2015-08-23 DIAGNOSIS — Z1212 Encounter for screening for malignant neoplasm of rectum: Secondary | ICD-10-CM | POA: Diagnosis not present

## 2015-08-23 DIAGNOSIS — Z124 Encounter for screening for malignant neoplasm of cervix: Secondary | ICD-10-CM | POA: Insufficient documentation

## 2015-08-23 DIAGNOSIS — Z0000 Encounter for general adult medical examination without abnormal findings: Secondary | ICD-10-CM

## 2015-08-23 DIAGNOSIS — Z01419 Encounter for gynecological examination (general) (routine) without abnormal findings: Secondary | ICD-10-CM | POA: Diagnosis not present

## 2015-08-23 NOTE — Progress Notes (Signed)
Patient ID: Ruth Rose, female   DOB: May 24, 1966, 50 y.o.   MRN: [id number] Subjective:     Ruth Rose is a 50 y.o. female here for a routine exam.  No LMP recorded. Patient has had an ablation. [id number] Birth Control Method:  BTL Menstrual Calendar(currently): amenorrheic since her ablation  Current complaints: none.   Current acute medical issues:  Chronic pain   Recent Gynecologic History No LMP recorded. Patient has had an ablation. Last Pap: 2015,  normal Last mammogram: 2016,  normal  Past Medical History  Diagnosis Date  . Chronic back pain   . Hypertension   . Depression   . Anxiety   . Acid reflux   . Restless leg syndrome   . Fibromyalgia     Past Surgical History  Procedure Laterality Date  . Tubal ligation    . Ablasion      OB History    Gravida Para Term Preterm AB TAB SAB Ectopic Multiple Living   Social History   Social History  . Marital Status: Divorced    Spouse Name: N/A  . Number of Children: N/A  . Years of Education: N/A   Social History Main Topics  . Smoking status: Never Smoker   . Smokeless tobacco: Never Used  . Alcohol Use: No  . Drug Use: No  . Sexual Activity: Yes    Birth Control/ Protection: None, Surgical   Other Topics Concern  . None   Social History Narrative    Family History  Problem Relation Age of Onset  . Cancer Mother   . Cancer Father   . Seizures Father   . Cancer Other   . Heart failure Other      Current outpatient prescriptions:  .  amphetamine-dextroamphetamine (ADDERALL XR) 20 MG 24 hr capsule, Take 20 mg by mouth daily., Disp: , Rfl:  .  benzonatate (TESSALON) 100 MG capsule, Take 1 capsule (100 mg total) by mouth 3 (three) times daily as needed for cough., Disp: 15 capsule, Rfl: 0 .  cetirizine (ZYRTEC) 10 MG tablet, Take 10 mg by mouth daily., Disp: , Rfl:  .  diazepam (VALIUM) 10 MG tablet, Take 10 mg by mouth 3 (three) times daily., Disp: , Rfl:  .  Gabapentin,  PHN, (GRALISE) 600 MG TABS, Take 1 tablet by mouth 3 (three) times daily., Disp: , Rfl:  .  Milnacipran (SAVELLA) 50 MG TABS tablet, Take 50 mg by mouth 2 (two) times daily. , Disp: , Rfl:  .  oxyCODONE-acetaminophen (PERCOCET/ROXICET) 5-325 MG per tablet, Take 1 tablet by mouth every 4 (four) hours as needed. (Patient taking differently: Take 1 tablet by mouth every 12 (twelve) hours. ), Disp: 16 tablet, Rfl: 0 .  SUMAtriptan (IMITREX) 50 MG tablet, Take 50 mg by mouth every 2 (two) hours as needed for migraine or headache. May repeat in 2 hours if headache persists or recurs., Disp: , Rfl:  .  traMADol (ULTRAM) 50 MG tablet, Take 50 mg by mouth every 6 (six) hours as needed for moderate pain., Disp: , Rfl:  .  Vitamin D, Ergocalciferol, (DRISDOL) 50000 UNITS CAPS capsule, Take 50,000 Units by mouth 2 (two) times a week. Takes on Sunday and Friday, Disp: , Rfl:   Review of Systems  Review of Systems  Constitutional: Negative for fever, chills, weight loss, malaise/fatigue and diaphoresis.  HENT: Negative for hearing loss, ear pain, nosebleeds,  congestion, sore throat, neck pain, tinnitus and ear discharge.   Eyes: Negative for blurred vision, double vision, photophobia, pain, discharge and redness.  Respiratory: Negative for cough, hemoptysis, sputum production, shortness of breath, wheezing and stridor.   Cardiovascular: Negative for chest pain, palpitations, orthopnea, claudication, leg swelling and PND.  Gastrointestinal: negative for abdominal pain. Negative for heartburn, nausea, vomiting, diarrhea, constipation, blood in stool and melena.  Genitourinary: Negative for dysuria, urgency, frequency, hematuria and flank pain.  Musculoskeletal: Negative for myalgias, back pain, joint pain and falls.  Skin: Negative for itching and rash.  Neurological: Negative for dizziness, tingling, tremors, sensory change, speech change, focal weakness, seizures, loss of consciousness, weakness and headaches.   Endo/Heme/Allergies: Negative for environmental allergies and polydipsia. Does not bruise/bleed easily.  Psychiatric/Behavioral: Negative for depression, suicidal ideas, hallucinations, memory loss and substance abuse. The patient is not nervous/anxious and does not have insomnia.        Objective:  Blood pressure 110/80, pulse 84, weight 124 lb (56.246 kg).   Physical Exam  Vitals reviewed. Constitutional: She is oriented to person, place, and time. She appears well-developed and well-nourished.  HENT:  Head: Normocephalic and atraumatic.        Right Ear: External ear normal.  Left Ear: External ear normal.  Nose: Nose normal.  Mouth/Throat: Oropharynx is clear and moist.  Eyes: Conjunctivae and EOM are normal. Pupils are equal, round, and reactive to light. Right eye exhibits no discharge. Left eye exhibits no discharge. No scleral icterus.  Neck: Normal range of motion. Neck supple. No tracheal deviation present. No thyromegaly present.  Cardiovascular: Normal rate, regular rhythm, normal heart sounds and intact distal pulses.  Exam reveals no gallop and no friction rub.   No murmur heard. Respiratory: Effort normal and breath sounds normal. No respiratory distress. She has no wheezes. She has no rales. She exhibits no tenderness.  GI: Soft. Bowel sounds are normal. She exhibits no distension and no mass. There is no tenderness. There is no rebound and no guarding.  Genitourinary:  Breasts no masses skin changes or nipple changes bilaterally      Vulva is normal without lesions Vagina is pink moist without discharge Cervix normal in appearance and pap is done Uterus is normal size shape and contour Adnexa is negative with normal sized ovaries  {Rectal    hemoccult negative, normal tone, no masses  Musculoskeletal: Normal range of motion. She exhibits no edema and no tenderness.  Neurological: She is alert and oriented to person, place, and time. She has normal reflexes. She  displays normal reflexes. No cranial nerve deficit. She exhibits normal muscle tone. Coordination normal.  Skin: Skin is warm and dry. No rash noted. No erythema. No pallor.  Psychiatric: She has a normal mood and affect. Her behavior is normal. Judgment and thought content normal.       Assessment:    Healthy female exam.    Plan:    Mammogram ordered. Follow up in: 1 year.

## 2015-08-28 LAB — CYTOLOGY - PAP

## 2015-10-07 ENCOUNTER — Encounter (HOSPITAL_COMMUNITY): Payer: Self-pay | Admitting: Emergency Medicine

## 2015-10-07 ENCOUNTER — Emergency Department (HOSPITAL_COMMUNITY)
Admission: EM | Admit: 2015-10-07 | Discharge: 2015-10-07 | Disposition: A | Discharge status: Home / Self Care | Payer: Medicare Other | Attending: Emergency Medicine | Admitting: Emergency Medicine

## 2015-10-07 DIAGNOSIS — M545 Low back pain: Secondary | ICD-10-CM | POA: Insufficient documentation

## 2015-10-07 DIAGNOSIS — Z9851 Tubal ligation status: Secondary | ICD-10-CM | POA: Diagnosis not present

## 2015-10-07 DIAGNOSIS — Z79899 Other long term (current) drug therapy: Secondary | ICD-10-CM | POA: Insufficient documentation

## 2015-10-07 DIAGNOSIS — M542 Cervicalgia: Secondary | ICD-10-CM | POA: Diagnosis not present

## 2015-10-07 DIAGNOSIS — M419 Scoliosis, unspecified: Secondary | ICD-10-CM | POA: Insufficient documentation

## 2015-10-07 DIAGNOSIS — I10 Essential (primary) hypertension: Secondary | ICD-10-CM | POA: Diagnosis not present

## 2015-10-07 DIAGNOSIS — F329 Major depressive disorder, single episode, unspecified: Secondary | ICD-10-CM | POA: Diagnosis not present

## 2015-10-07 MED ORDER — KETOROLAC TROMETHAMINE 60 MG/2ML IM SOLN
60.0000 mg | Freq: Once | INTRAMUSCULAR | Status: AC
  Administered 2015-10-07: 60 mg via INTRAMUSCULAR
  Filled 2015-10-07: qty 2

## 2015-10-07 MED ORDER — DEXAMETHASONE SODIUM PHOSPHATE 4 MG/ML IJ SOLN
8.0000 mg | Freq: Once | INTRAMUSCULAR | Status: AC
  Administered 2015-10-07: 8 mg via INTRAMUSCULAR
  Filled 2015-10-07: qty 2
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
## 2015-10-07 NOTE — Discharge Instructions (Signed)
Your blood pressure is slightly elevated at 163/89, otherwise your vital signs are within normal limits. There are no acute neurologic changes on examination at this time. Please discuss your breakthrough pains with Dr. Loney HeringBluth. Heat to the areas may be helpful. Back Pain, Adult Back pain is very common in adults.The cause of back pain is rarely dangerous and the pain often gets better over time.The cause of your back pain may not be known. Some common causes of back pain include:  Strain of the muscles or ligaments supporting the spine.  Wear and tear (degeneration) of the spinal disks.  Arthritis.  Direct injury to the back. For many people, back pain may return. Since back pain is rarely dangerous, most people can learn to manage this condition on their own. HOME CARE INSTRUCTIONS Watch your back pain for any changes. The following actions may help to lessen any discomfort you are feeling:  Remain active. It is stressful on your back to sit or stand in one place for long periods of time. Do not sit, drive, or stand in one place for more than 30 minutes at a time. Take short walks on even surfaces as soon as you are able.Try to increase the length of time you walk each day.  Exercise regularly as directed by your health care provider. Exercise helps your back heal faster. It also helps avoid future injury by keeping your muscles strong and flexible.  Do not stay in bed.Resting more than 1-2 days can delay your recovery.  Pay attention to your body when you bend and lift. The most comfortable positions are those that put less stress on your recovering back. Always use proper lifting techniques, including:  Bending your knees.  Keeping the load close to your body.  Avoiding twisting.  Find a comfortable position to sleep. Use a firm mattress and lie on your side with your knees slightly bent. If you lie on your back, put a pillow under your knees.  Avoid feeling anxious or  stressed.Stress increases muscle tension and can worsen back pain.It is important to recognize when you are anxious or stressed and learn ways to manage it, such as with exercise.  Take medicines only as directed by your health care provider. Over-the-counter medicines to reduce pain and inflammation are often the most helpful.Your health care provider may prescribe muscle relaxant drugs.These medicines help dull your pain so you can more quickly return to your normal activities and healthy exercise.  Apply ice to the injured area:  Put ice in a plastic bag.  Place a towel between your skin and the bag.  Leave the ice on for 20 minutes, 2-3 times a day for the first 2-3 days. After that, ice and heat may be alternated to reduce pain and spasms.  Maintain a healthy weight. Excess weight puts extra stress on your back and makes it difficult to maintain good posture. SEEK MEDICAL CARE IF:  You have pain that is not relieved with rest or medicine.  You have increasing pain going down into the legs or buttocks.  You have pain that does not improve in one week.  You have night pain.  You lose weight.  You have a fever or chills. SEEK IMMEDIATE MEDICAL CARE IF:   You develop new bowel or bladder control problems.  You have unusual weakness or numbness in your arms or legs.  You develop nausea or vomiting.  You develop abdominal pain.  You feel faint.   This information is not  intended to replace advice given to you by your health care provider. Make sure you discuss any questions you have with your health care provider.   Document Released: 07/21/2005 Document Revised: 08/11/2014 Document Reviewed: 11/22/2013 Elsevier Interactive Patient Education [employer].

## 2015-10-07 NOTE — ED Notes (Signed)
Patient c/o neck and lower back pain. Patient denies any injuries. Denies any urinary symptoms. Denies any complication with urination or BMs.

## 2016-02-16 ENCOUNTER — Encounter (HOSPITAL_COMMUNITY): Payer: Self-pay

## 2016-02-16 ENCOUNTER — Emergency Department (HOSPITAL_COMMUNITY)
Admission: EM | Admit: 2016-02-16 | Discharge: 2016-02-16 | Disposition: A | Discharge status: Home / Self Care | Payer: Medicare Other | Attending: Emergency Medicine | Admitting: Emergency Medicine

## 2016-02-16 DIAGNOSIS — H65193 Other acute nonsuppurative otitis media, bilateral: Secondary | ICD-10-CM | POA: Diagnosis not present

## 2016-02-16 DIAGNOSIS — H9203 Otalgia, bilateral: Secondary | ICD-10-CM | POA: Diagnosis present

## 2016-02-16 DIAGNOSIS — Z79899 Other long term (current) drug therapy: Secondary | ICD-10-CM | POA: Insufficient documentation

## 2016-02-16 DIAGNOSIS — I10 Essential (primary) hypertension: Secondary | ICD-10-CM | POA: Diagnosis not present

## 2016-02-16 DIAGNOSIS — F329 Major depressive disorder, single episode, unspecified: Secondary | ICD-10-CM | POA: Diagnosis not present

## 2016-02-16 DIAGNOSIS — H65196 Other acute nonsuppurative otitis media, recurrent, bilateral: Secondary | ICD-10-CM

## 2016-02-16 MED ORDER — LIDOCAINE HCL (PF) 1 % IJ SOLN
INTRAMUSCULAR | Status: AC
  Administered 2016-02-16: 5 mL
  Filled 2016-02-16: qty 5

## 2016-02-16 MED ORDER — HYDROCODONE-ACETAMINOPHEN 5-325 MG PO TABS
1.0000 | ORAL_TABLET | Freq: Once | ORAL | Status: AC
  Administered 2016-02-16: 1 via ORAL
  Filled 2016-02-16: qty 1

## 2016-02-16 MED ORDER — DICLOFENAC SODIUM 75 MG PO TBEC: 75.0000 mg | DELAYED_RELEASE_TABLET | Freq: Two times a day (BID) | ORAL | Status: DC

## 2016-02-16 MED ORDER — CEFTRIAXONE SODIUM 1 G IJ SOLR
1.0000 g | Freq: Once | INTRAMUSCULAR | Status: AC
  Administered 2016-02-16: 1 g via INTRAMUSCULAR
  Filled 2016-02-16: qty 10

## 2016-02-16 NOTE — ED Notes (Signed)
I went to Urgent care on Thursday the 6th and was diagnosed with a double ear infection.  They put me on Amoxil.  I followed up with my MD on Monday and he said the left ear was still infected.  Still taking Amoxil and my left ear is killing me.

## 2016-02-16 NOTE — ED Notes (Signed)
Patient verbalizes understanding of discharge instructions, prescriptions, home care and follow up care. Patient out of department at this time. 

## 2016-02-16 NOTE — Discharge Instructions (Signed)
Otitis Media, Adult °Otitis media is redness, soreness, and puffiness (swelling) in the space just behind your eardrum (middle ear). It may be caused by allergies or infection. It often happens along with a cold. °HOME CARE °· Take your medicine as told. Finish it even if you start to feel better. °· Only take over-the-counter or prescription medicines for pain, discomfort, or fever as told by your doctor. °· Follow up with your doctor as told. °GET HELP IF: °· You have otitis media only in one ear, or bleeding from your nose, or both. °· You notice a lump on your neck. °· You are not getting better in 3-5 days. °· You feel worse instead of better. °GET HELP RIGHT AWAY IF:  °· You have pain that is not helped with medicine. °· You have puffiness, redness, or pain around your ear. °· You get a stiff neck. °· You cannot move part of your face (paralysis). °· You notice that the bone behind your ear hurts when you touch it. °MAKE SURE YOU:  °· Understand these instructions. °· Will watch your condition. °· Will get help right away if you are not doing well or get worse. °  °This information is not intended to replace advice given to you by your health care provider. Make sure you discuss any questions you have with your health care provider. °  °Document Released: 01/07/2008 Document Revised: 08/11/2014 Document Reviewed: 02/15/2013 °Elsevier Interactive Patient Education ©2016 Elsevier Inc. ° ° °

## 2016-02-19 NOTE — ED Provider Notes (Signed)
CSN: [id number]     Arrival date & time 02/16/16  1902 History   First MD Initiated Contact with Patient 02/16/16 1916     Chief Complaint  Patient presents with  . Otalgia     (Consider location/radiation/quality/duration/timing/severity/associated sxs/prior Treatment) HPI  Ruth Rose is a 50 y.o. female who presents to the Emergency Department complaining of bilateral ear pain and infection for nearly two weeks.  She stats that she was seen at urgent care and prescribed amoxil which she has been taking without relief.  Also seen by her PMD and advised to continue the amoxil.  She reports persistent pain to both ears but left ear is worse.  She denies fever, decreased hearing, dizziness, headaches and dental pain.     Past Medical History  Diagnosis Date  . Chronic back pain   . Hypertension   . Depression   . Anxiety   . Acid reflux   . Restless leg syndrome   . Fibromyalgia    Past Surgical History  Procedure Laterality Date  . Tubal ligation    . Ablasion     Family History  Problem Relation Age of Onset  . Cancer Mother   . Cancer Father   . Seizures Father   . Cancer Other   . Heart failure Other    Social History  Substance Use Topics  . Smoking status: Never Smoker   . Smokeless tobacco: Never Used  . Alcohol Use: No   OB History    Gravida Para Term Preterm AB TAB SAB Ectopic Multiple Living   Review of Systems  Constitutional: Negative for fever, chills, activity change and appetite change.  HENT: Positive for ear pain. Negative for congestion, ear discharge, facial swelling, rhinorrhea, sore throat and trouble swallowing.   Eyes: Negative for visual disturbance.  Respiratory: Negative for cough, shortness of breath, wheezing and stridor.   Gastrointestinal: Negative for nausea and vomiting.  Musculoskeletal: Negative for neck pain and neck stiffness.  Skin: Negative.   Neurological: Negative for dizziness, weakness, numbness  and headaches.  Hematological: Negative for adenopathy.  Psychiatric/Behavioral: Negative for confusion.  All other systems reviewed and are negative.     Allergies  Phenergan  Home Medications   Prior to Admission medications   Medication Sig Start Date End Date Taking? Authorizing Provider  amphetamine-dextroamphetamine (ADDERALL XR) 20 MG 24 hr capsule Take 20 mg by mouth daily.    Historical Provider, MD  benzonatate (TESSALON) 100 MG capsule Take 1 capsule (100 mg total) by mouth 3 (three) times daily as needed for cough. 05/20/15   Samuel Jester, DO  cetirizine (ZYRTEC) 10 MG tablet Take 10 mg by mouth daily.    Historical Provider, MD  diazepam (VALIUM) 10 MG tablet Take 10 mg by mouth 3 (three) times daily.    Historical Provider, MD  diclofenac (VOLTAREN) 75 MG EC tablet Take 1 tablet (75 mg total) by mouth 2 (two) times daily. Take with food 02/16/16   Dellia Donnelly, PA-C  Gabapentin, PHN, (GRALISE) 600 MG TABS Take 1 tablet by mouth 3 (three) times daily.    Historical Provider, MD  Milnacipran (SAVELLA) 50 MG TABS tablet Take 50 mg by mouth 2 (two) times daily.     Historical Provider, MD  oxyCODONE-acetaminophen (PERCOCET/ROXICET) 5-325 MG per tablet Take 1 tablet by mouth every 4 (four) hours as needed. Patient taking differently: Take 1 tablet by mouth  every 12 (twelve) hours.  08/22/14   Finlay Mills, PA-C  SUMAtriptan (IMITREX) 50 MG tablet Take 50 mg by mouth every 2 (two) hours as needed for migraine or headache. May repeat in 2 hours if headache persists or recurs.    Historical Provider, MD  traMADol (ULTRAM) 50 MG tablet Take 50 mg by mouth every 6 (six) hours as needed for moderate pain.    Historical Provider, MD  Vitamin D, Ergocalciferol, (DRISDOL) 50000 UNITS CAPS capsule Take 50,000 Units by mouth 2 (two) times a week. Takes on Sunday and Friday    Historical Provider, MD   BP 146/91 mmHg  Pulse 92  Temp(Src) 98.9 F (37.2 C) (Oral)  Resp 16  Ht 5\' 2"   (1.575 m)  Wt 54.432 kg  BMI 21.94 kg/m2  SpO2 100% Physical Exam  Constitutional: She is oriented to person, place, and time. She appears well-developed and well-nourished. No distress.  HENT:  Head: Normocephalic and atraumatic.  Mouth/Throat: Uvula is midline, oropharynx is clear and moist and mucous membranes are normal. No uvula swelling. No oropharyngeal exudate.  Erythema of the bilateral TM.  Mild middle ear effusions present.  No drainage or edema of the ear canal, TMs appears intact.    Neck: Normal range of motion. Neck supple.  Cardiovascular: Normal rate, regular rhythm, normal heart sounds and intact distal pulses.   No murmur heard. Pulmonary/Chest: Effort normal and breath sounds normal. No stridor. No respiratory distress.  Musculoskeletal: Normal range of motion.  Lymphadenopathy:    She has no cervical adenopathy.  Neurological: She is alert and oriented to person, place, and time. Coordination normal.  Skin: Skin is warm and dry. No rash noted.  Nursing note and vitals reviewed.   ED Course  Procedures (including critical care time) Labs Review Labs Reviewed - No data to display  Imaging Review No results found. I have personally reviewed and evaluated these images and lab results as part of my medical decision-making.   EKG Interpretation None      MDM   Final diagnoses:  Other recurrent acute nonsuppurative otitis media of both ears    Pt well appearing.  Currently taking amoxil for OM, unimproved.   IM rocephin given, po vicodin  Pt will continue amoxil, rx for diclofenac.  Stable for d/c    Pauline Ausammy Chyenne Sobczak, PA-C 02/19/16 2215  Donnetta HutchingBrian Cook, MD 02/21/16 [phone]

## 2016-04-28 ENCOUNTER — Other Ambulatory Visit: Payer: Self-pay | Admitting: Neurology

## 2016-04-28 ENCOUNTER — Ambulatory Visit (HOSPITAL_COMMUNITY)
Admission: RE | Admit: 2016-04-28 | Discharge: 2016-04-28 | Disposition: A | Discharge status: Home / Self Care | Payer: Medicare Other | Source: Ambulatory Visit | Attending: Neurology | Admitting: Neurology

## 2016-04-28 DIAGNOSIS — M419 Scoliosis, unspecified: Secondary | ICD-10-CM | POA: Diagnosis not present

## 2016-04-28 DIAGNOSIS — M549 Dorsalgia, unspecified: Secondary | ICD-10-CM | POA: Diagnosis not present

## 2016-05-05 ENCOUNTER — Other Ambulatory Visit: Payer: Self-pay | Admitting: Otolaryngology

## 2016-05-05 DIAGNOSIS — H9209 Otalgia, unspecified ear: Secondary | ICD-10-CM

## 2016-05-05 DIAGNOSIS — J039 Acute tonsillitis, unspecified: Secondary | ICD-10-CM

## 2016-05-09 ENCOUNTER — Ambulatory Visit
Admission: RE | Admit: 2016-05-09 | Discharge: 2016-05-09 | Disposition: A | Discharge status: Home / Self Care | Payer: Medicare Other | Source: Ambulatory Visit | Attending: Otolaryngology | Admitting: Otolaryngology

## 2016-05-09 DIAGNOSIS — J039 Acute tonsillitis, unspecified: Secondary | ICD-10-CM

## 2016-05-09 DIAGNOSIS — H9209 Otalgia, unspecified ear: Secondary | ICD-10-CM

## 2016-05-09 MED ORDER — IOPAMIDOL (ISOVUE-300) INJECTION 61%
75.0000 mL | Freq: Once | INTRAVENOUS | Status: AC | PRN
  Administered 2016-05-09: 75 mL via INTRAVENOUS

## 2016-05-27 ENCOUNTER — Other Ambulatory Visit: Payer: Self-pay

## 2016-10-28 ENCOUNTER — Ambulatory Visit (INDEPENDENT_AMBULATORY_CARE_PROVIDER_SITE_OTHER): Payer: Medicare Other | Admitting: Obstetrics & Gynecology

## 2016-10-28 ENCOUNTER — Encounter (INDEPENDENT_AMBULATORY_CARE_PROVIDER_SITE_OTHER): Payer: Self-pay

## 2016-10-28 ENCOUNTER — Other Ambulatory Visit (HOSPITAL_COMMUNITY)
Admission: RE | Admit: 2016-10-28 | Discharge: 2016-10-28 | Disposition: A | Discharge status: Home / Self Care | Payer: Medicare Other | Source: Ambulatory Visit | Attending: Obstetrics & Gynecology | Admitting: Obstetrics & Gynecology

## 2016-10-28 ENCOUNTER — Encounter: Payer: Self-pay | Admitting: Obstetrics & Gynecology

## 2016-10-28 VITALS — BP 118/74 | HR 72 | Ht 61.0 in | Wt 133.0 lb

## 2016-10-28 DIAGNOSIS — Z01419 Encounter for gynecological examination (general) (routine) without abnormal findings: Secondary | ICD-10-CM

## 2016-10-28 DIAGNOSIS — Z124 Encounter for screening for malignant neoplasm of cervix: Secondary | ICD-10-CM

## 2016-10-28 DIAGNOSIS — Z113 Encounter for screening for infections with a predominantly sexual mode of transmission: Secondary | ICD-10-CM | POA: Diagnosis present

## 2016-10-28 NOTE — Progress Notes (Signed)
Subjective:     Ruth Rose is a 51 y.o. female here for a routine exam.  No LMP recorded. Patient has had an ablation. [id number] Birth Control Method:  BTL Menstrual Calendar(currently): amenorrheic, ablation  Current complaints: none.   Current acute medical issues:  fibromyalgia   Recent Gynecologic History No LMP recorded. Patient has had an ablation. Last Pap: 2017,  normal Last mammogram: 2016,  normal  Past Medical History:  Diagnosis Date  . Acid reflux   . Anxiety   . Chronic back pain   . Depression   . Fibromyalgia   . Hypertension   . Restless leg syndrome     Past Surgical History:  Procedure Laterality Date  . ablasion    . TUBAL LIGATION      OB History    Gravida Para Term Preterm AB Living   9 6 6   3      SAB TAB Ectopic Multiple Live Births   3              Social History   Social History  . Marital status: Divorced    Spouse name: N/A  . Number of children: N/A  . Years of education: N/A   Social History Main Topics  . Smoking status: Never Smoker  . Smokeless tobacco: Never Used  . Alcohol use No  . Drug use: No  . Sexual activity: Yes    Birth control/ protection: None, Surgical   Other Topics Concern  . None   Social History Narrative  . None    Family History  Problem Relation Age of Onset  . Cancer Mother   . Cancer Father   . Seizures Father   . Cancer Other   . Heart failure Other      Current Outpatient Prescriptions:  .  amphetamine-dextroamphetamine (ADDERALL XR) 20 MG 24 hr capsule, Take 20 mg by mouth daily., Disp: , Rfl:  .  benzonatate (TESSALON) 100 MG capsule, Take 1 capsule (100 mg total) by mouth 3 (three) times daily as needed for cough., Disp: 15 capsule, Rfl: 0 .  cetirizine (ZYRTEC) 10 MG tablet, Take 10 mg by mouth daily., Disp: , Rfl:  .  diazepam (VALIUM) 10 MG tablet, Take 10 mg by mouth 3 (three) times daily., Disp: , Rfl:  .  diclofenac (VOLTAREN) 75 MG EC tablet, Take 1 tablet (75 mg total)  by mouth 2 (two) times daily. Take with food, Disp: 14 tablet, Rfl: 0 .  Gabapentin, PHN, (GRALISE) 600 MG TABS, Take 1 tablet by mouth 3 (three) times daily., Disp: , Rfl:  .  Milnacipran (SAVELLA) 50 MG TABS tablet, Take 50 mg by mouth 2 (two) times daily. , Disp: , Rfl:  .  SUMAtriptan (IMITREX) 50 MG tablet, Take 50 mg by mouth every 2 (two) hours as needed for migraine or headache. May repeat in 2 hours if headache persists or recurs., Disp: , Rfl:  .  traMADol (ULTRAM) 50 MG tablet, Take 50 mg by mouth every 6 (six) hours as needed for moderate pain., Disp: , Rfl:  .  Vitamin D, Ergocalciferol, (DRISDOL) 50000 UNITS CAPS capsule, Take 50,000 Units by mouth 2 (two) times a week. Takes on Sunday and Friday, Disp: , Rfl:  .  oxyCODONE-acetaminophen (PERCOCET/ROXICET) 5-325 MG per tablet, Take 1 tablet by mouth every 4 (four) hours as needed. (Patient not taking: Reported on 10/28/2016), Disp: 16 tablet, Rfl: 0  Review of Systems  Review of Systems  Constitutional: Negative  for fever, chills, weight loss, malaise/fatigue and diaphoresis.  HENT: Negative for hearing loss, ear pain, nosebleeds, congestion, sore throat, neck pain, tinnitus and ear discharge.   Eyes: Negative for blurred vision, double vision, photophobia, pain, discharge and redness.  Respiratory: Negative for cough, hemoptysis, sputum production, shortness of breath, wheezing and stridor.   Cardiovascular: Negative for chest pain, palpitations, orthopnea, claudication, leg swelling and PND.  Gastrointestinal: negative for abdominal pain. Negative for heartburn, nausea, vomiting, diarrhea, constipation, blood in stool and melena.  Genitourinary: Negative for dysuria, urgency, frequency, hematuria and flank pain.  Musculoskeletal: Negative for myalgias, back pain, joint pain and falls.  Skin: Negative for itching and rash.  Neurological: Negative for dizziness, tingling, tremors, sensory change, speech change, focal weakness,  seizures, loss of consciousness, weakness and headaches.  Endo/Heme/Allergies: Negative for environmental allergies and polydipsia. Does not bruise/bleed easily.  Psychiatric/Behavioral: Negative for depression, suicidal ideas, hallucinations, memory loss and substance abuse. The patient is not nervous/anxious and does not have insomnia.        Objective:  Blood pressure 118/74, pulse 72, height 5\' 1"  (1.549 m), weight 133 lb (60.3 kg).   Physical Exam  Vitals reviewed. Constitutional: She is oriented to person, place, and time. She appears well-developed and well-nourished.  HENT:  Head: Normocephalic and atraumatic.        Right Ear: External ear normal.  Left Ear: External ear normal.  Nose: Nose normal.  Mouth/Throat: Oropharynx is clear and moist.  Eyes: Conjunctivae and EOM are normal. Pupils are equal, round, and reactive to light. Right eye exhibits no discharge. Left eye exhibits no discharge. No scleral icterus.  Neck: Normal range of motion. Neck supple. No tracheal deviation present. No thyromegaly present.  Cardiovascular: Normal rate, regular rhythm, normal heart sounds and intact distal pulses.  Exam reveals no gallop and no friction rub.   No murmur heard. Respiratory: Effort normal and breath sounds normal. No respiratory distress. She has no wheezes. She has no rales. She exhibits no tenderness.  GI: Soft. Bowel sounds are normal. She exhibits no distension and no mass. There is no tenderness. There is no rebound and no guarding.  Genitourinary:  Breasts no masses skin changes or nipple changes bilaterally      Vulva is normal without lesions Vagina is pink moist without discharge Cervix normal in appearance and pap is done Uterus is normal size shape and contour Adnexa is negative with normal sized ovaries   Musculoskeletal: Normal range of motion. She exhibits no edema and no tenderness.  Neurological: She is alert and oriented to person, place, and time. She has  normal reflexes. She displays normal reflexes. No cranial nerve deficit. She exhibits normal muscle tone. Coordination normal.  Skin: Skin is warm and dry. No rash noted. No erythema. No pallor.  Psychiatric: She has a normal mood and affect. Her behavior is normal. Judgment and thought content normal.       Medications Ordered at today's visit: No orders of the defined types were placed in this encounter.   Other orders placed at today's visit: No orders of the defined types were placed in this encounter.     Assessment:    Healthy female exam.    Plan:    Mammogram ordered. Follow up in: 1 year.     Return in about 1 year (around 10/28/2017) for yearly.

## 2016-10-30 LAB — CYTOLOGY - PAP
Chlamydia: NEGATIVE
Diagnosis: NEGATIVE
Neisseria Gonorrhea: NEGATIVE

## 2016-11-10 ENCOUNTER — Telehealth: Payer: Self-pay | Admitting: Obstetrics & Gynecology

## 2016-11-10 NOTE — Telephone Encounter (Signed)
Pt informed of normal pap results.  ?

## 2016-11-10 NOTE — Telephone Encounter (Signed)
Pt called stating that she would like to know the results of her pap that she had on 3/27. Please contact pt

## 2017-12-08 ENCOUNTER — Other Ambulatory Visit: Payer: Medicare Other | Admitting: Obstetrics & Gynecology

## 2018-01-04 ENCOUNTER — Ambulatory Visit (HOSPITAL_COMMUNITY): Payer: Medicare Other

## 2018-01-04 ENCOUNTER — Ambulatory Visit (INDEPENDENT_AMBULATORY_CARE_PROVIDER_SITE_OTHER): Payer: Medicare Other | Admitting: Obstetrics & Gynecology

## 2018-01-04 ENCOUNTER — Other Ambulatory Visit: Payer: Self-pay | Admitting: Obstetrics & Gynecology

## 2018-01-04 ENCOUNTER — Other Ambulatory Visit (HOSPITAL_COMMUNITY)
Admission: RE | Admit: 2018-01-04 | Discharge: 2018-01-04 | Disposition: A | Discharge status: Home / Self Care | Payer: Medicare Other | Source: Ambulatory Visit | Attending: Obstetrics & Gynecology | Admitting: Obstetrics & Gynecology

## 2018-01-04 ENCOUNTER — Encounter: Payer: Self-pay | Admitting: Obstetrics & Gynecology

## 2018-01-04 VITALS — BP 130/92 | HR 67 | Ht 60.5 in | Wt 142.0 lb

## 2018-01-04 DIAGNOSIS — Z01419 Encounter for gynecological examination (general) (routine) without abnormal findings: Secondary | ICD-10-CM | POA: Diagnosis not present

## 2018-01-04 DIAGNOSIS — Z1212 Encounter for screening for malignant neoplasm of rectum: Secondary | ICD-10-CM | POA: Diagnosis not present

## 2018-01-04 DIAGNOSIS — Z1231 Encounter for screening mammogram for malignant neoplasm of breast: Secondary | ICD-10-CM

## 2018-01-04 DIAGNOSIS — Z124 Encounter for screening for malignant neoplasm of cervix: Secondary | ICD-10-CM | POA: Diagnosis present

## 2018-01-04 DIAGNOSIS — Z1211 Encounter for screening for malignant neoplasm of colon: Secondary | ICD-10-CM | POA: Diagnosis not present

## 2018-01-04 LAB — HEMOCCULT GUIAC POC 1CARD (OFFICE): FECAL OCCULT BLD: NEGATIVE

## 2018-01-04 NOTE — Addendum Note (Signed)
Addended by: Colen DarlingYOUNG, Akirah Storck S on: 01/04/2018 10:27 AM   Modules accepted: Orders

## 2018-01-04 NOTE — Progress Notes (Signed)
Subjective:     Ruth Rose is a 52 y.o. female here for a routine exam.  No LMP recorded. Patient has had an ablation. [id number] Birth Control Method:  Tubal  ligation Menstrual Calendar(currently): amenorrheic since endometrial ablation  Current complaints: none.   Current acute medical issues:  none   Recent Gynecologic History No LMP recorded. Patient has had an ablation. Last Pap: 2018,  normal Last mammogram: scheduled today,    Past Medical History:  Diagnosis Date  . Acid reflux   . Anxiety   . Chronic back pain   . Depression   . Fibromyalgia   . Hypertension   . Restless leg syndrome     Past Surgical History:  Procedure Laterality Date  . ablasion    . TUBAL LIGATION      OB History    Gravida  9   Para  6   Term  6   Preterm      AB  3   Living        SAB  3   TAB      Ectopic      Multiple      Live Births              Social History   Socioeconomic History  . Marital status: Divorced    Spouse name: Not on file  . Number of children: Not on file  . Years of education: Not on file  . Highest education level: Not on file  Occupational History  . Not on file  Social Needs  . Financial resource strain: Not on file  . Food insecurity:    Worry: Not on file    Inability: Not on file  . Transportation needs:    Medical: Not on file    Non-medical: Not on file  Tobacco Use  . Smoking status: Never Smoker  . Smokeless tobacco: Never Used  Substance and Sexual Activity  . Alcohol use: No  . Drug use: No  . Sexual activity: Yes    Birth control/protection: Surgical    Comment: tubal and ablation  Lifestyle  . Physical activity:    Days per week: Not on file    Minutes per session: Not on file  . Stress: Not on file  Relationships  . Social connections:    Talks on phone: Not on file    Gets together: Not on file    Attends religious service: Not on file    Active member of club or organization: Not on file   Attends meetings of clubs or organizations: Not on file    Relationship status: Not on file  Other Topics Concern  . Not on file  Social History Narrative  . Not on file    Family History  Problem Relation Age of Onset  . Cancer Mother   . Cancer Father   . Seizures Father   . Cancer Other   . Heart failure Other   . Other Paternal Grandfather        MVA  . Congestive Heart Failure Paternal Grandmother   . Other Maternal Grandmother        brain tumor  . Congestive Heart Failure Maternal Grandfather      Current Outpatient Medications:  .  ALPRAZolam (XANAX) 1 MG tablet, Take 1 mg by mouth as needed. , Disp: , Rfl:  .  amitriptyline (ELAVIL) 25 MG tablet, 25 MG DAILY CAN TAKE UP TO 300 MG DAILY, Disp: ,  Rfl:  .  cetirizine (ZYRTEC) 10 MG tablet, Take 10 mg by mouth daily., Disp: , Rfl:  .  diazepam (VALIUM) 10 MG tablet, Take 10 mg by mouth 3 (three) times daily., Disp: , Rfl:  .  Milnacipran (SAVELLA) 50 MG TABS tablet, Take 50 mg by mouth 2 (two) times daily. , Disp: , Rfl:  .  oxyCODONE-acetaminophen (PERCOCET/ROXICET) 5-325 MG per tablet, Take 1 tablet by mouth every 4 (four) hours as needed., Disp: 16 tablet, Rfl: 0 .  SUMAtriptan (IMITREX) 50 MG tablet, Take 50 mg by mouth every 2 (two) hours as needed for migraine or headache. May repeat in 2 hours if headache persists or recurs., Disp: , Rfl:   Review of Systems  Review of Systems  Constitutional: Negative for fever, chills, weight loss, malaise/fatigue and diaphoresis.  HENT: Negative for hearing loss, ear pain, nosebleeds, congestion, sore throat, neck pain, tinnitus and ear discharge.   Eyes: Negative for blurred vision, double vision, photophobia, pain, discharge and redness.  Respiratory: Negative for cough, hemoptysis, sputum production, shortness of breath, wheezing and stridor.   Cardiovascular: Negative for chest pain, palpitations, orthopnea, claudication, leg swelling and PND.  Gastrointestinal: negative  for abdominal pain. Negative for heartburn, nausea, vomiting, diarrhea, constipation, blood in stool and melena.  Genitourinary: Negative for dysuria, urgency, frequency, hematuria and flank pain.  Musculoskeletal: Negative for myalgias, back pain, joint pain and falls.  Skin: Negative for itching and rash.  Neurological: Negative for dizziness, tingling, tremors, sensory change, speech change, focal weakness, seizures, loss of consciousness, weakness and headaches.  Endo/Heme/Allergies: Negative for environmental allergies and polydipsia. Does not bruise/bleed easily.  Psychiatric/Behavioral: Negative for depression, suicidal ideas, hallucinations, memory loss and substance abuse. The patient is not nervous/anxious and does not have insomnia.        Objective:  Blood pressure (!) 130/92, pulse 67, height 5' 0.5" (1.537 m), weight 142 lb (64.4 kg).   Physical Exam  Vitals reviewed. Constitutional: She is oriented to person, place, and time. She appears well-developed and well-nourished.  HENT:  Head: Normocephalic and atraumatic.        Right Ear: External ear normal.  Left Ear: External ear normal.  Nose: Nose normal.  Mouth/Throat: Oropharynx is clear and moist.  Eyes: Conjunctivae and EOM are normal. Pupils are equal, round, and reactive to light. Right eye exhibits no discharge. Left eye exhibits no discharge. No scleral icterus.  Neck: Normal range of motion. Neck supple. No tracheal deviation present. No thyromegaly present.  Cardiovascular: Normal rate, regular rhythm, normal heart sounds and intact distal pulses.  Exam reveals no gallop and no friction rub.   No murmur heard. Respiratory: Effort normal and breath sounds normal. No respiratory distress. She has no wheezes. She has no rales. She exhibits no tenderness.  GI: Soft. Bowel sounds are normal. She exhibits no distension and no mass. There is no tenderness. There is no rebound and no guarding.  Genitourinary:  Breasts no  masses skin changes or nipple changes bilaterally      Vulva is normal without lesions Vagina is pink moist without discharge Cervix normal in appearance and pap is  done Uterus is normal size shape and contour Adnexa is negative with normal sized ovaries  {Rectal    hemoccult negative, normal tone, no masses  Musculoskeletal: Normal range of motion. She exhibits no edema and no tenderness.  Neurological: She is alert and oriented to person, place, and time. She has normal reflexes. She displays normal reflexes. No cranial  nerve deficit. She exhibits normal muscle tone. Coordination normal.  Skin: Skin is warm and dry. No rash noted. No erythema. No pallor.  Psychiatric: She has a normal mood and affect. Her behavior is normal. Judgment and thought content normal.       Medications Ordered at today's visit: No orders of the defined types were placed in this encounter.   Other orders placed at today's visit: No orders of the defined types were placed in this encounter.     Assessment:    Healthy female exam.   no menopausal symptoms Plan:    Mammogram ordered. Follow up in: 1 year.     Return in about 1 year (around 01/05/2019) for yearly, with Dr Despina Hidden.

## 2018-01-06 ENCOUNTER — Ambulatory Visit (HOSPITAL_COMMUNITY)
Admission: RE | Admit: 2018-01-06 | Discharge: 2018-01-06 | Disposition: A | Discharge status: Home / Self Care | Payer: Medicare Other | Source: Ambulatory Visit | Attending: Obstetrics & Gynecology | Admitting: Obstetrics & Gynecology

## 2018-01-06 DIAGNOSIS — Z1231 Encounter for screening mammogram for malignant neoplasm of breast: Secondary | ICD-10-CM | POA: Insufficient documentation

## 2018-01-06 LAB — CYTOLOGY - PAP
ADEQUACY: ABSENT
Diagnosis: NEGATIVE
HPV: NOT DETECTED

## 2018-01-12 ENCOUNTER — Telehealth: Payer: Self-pay | Admitting: *Deleted

## 2018-01-12 NOTE — Telephone Encounter (Signed)
Patient called with concerns of her mammogram letter that stated she needed a f/u.  Informed patient that the impression showed no mammographic evidence of malignancy.  Advised patient to call radiology to have someone go over the letter with her.  Patient stated she had their number and would call.

## 2018-02-25 ENCOUNTER — Telehealth: Payer: Self-pay | Admitting: Obstetrics & Gynecology

## 2018-02-25 NOTE — Telephone Encounter (Signed)
Patient called stating that she would like to know the results of her pap, please contact pt °

## 2018-02-25 NOTE — Telephone Encounter (Signed)
LMOVM that pap was normal.   

## 2018-12-22 IMAGING — MG DIGITAL SCREENING BILATERAL MAMMOGRAM WITH TOMO AND CAD
7 series · 8 of 23 positions shown · non-contrast
Comparison: Previous exam(s).

CLINICAL DATA: Screening.

EXAM:
DIGITAL SCREENING BILATERAL MAMMOGRAM WITH TOMO AND CAD

[L MLO]
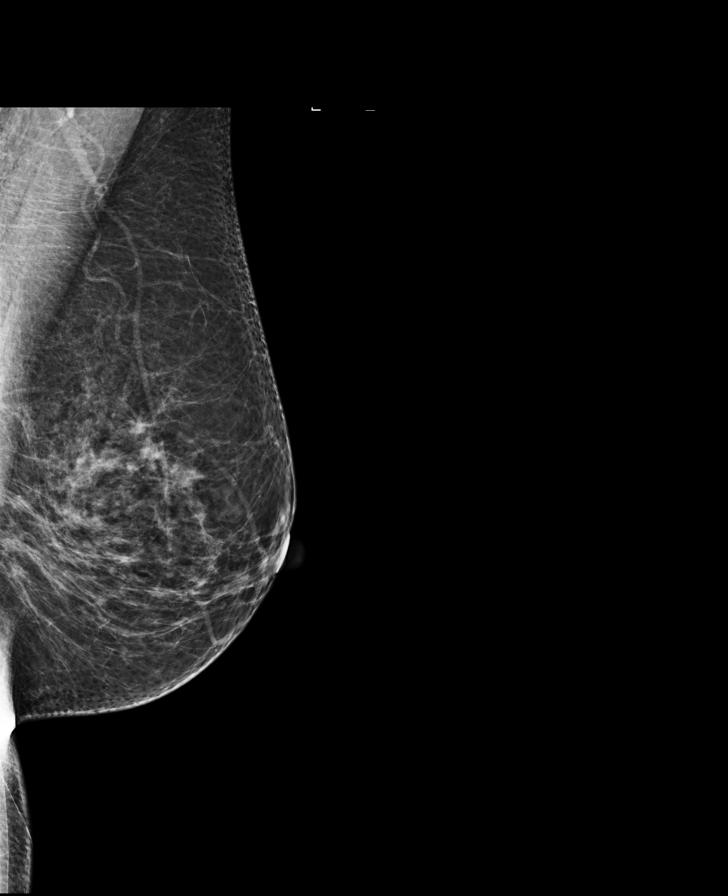

[L CC]
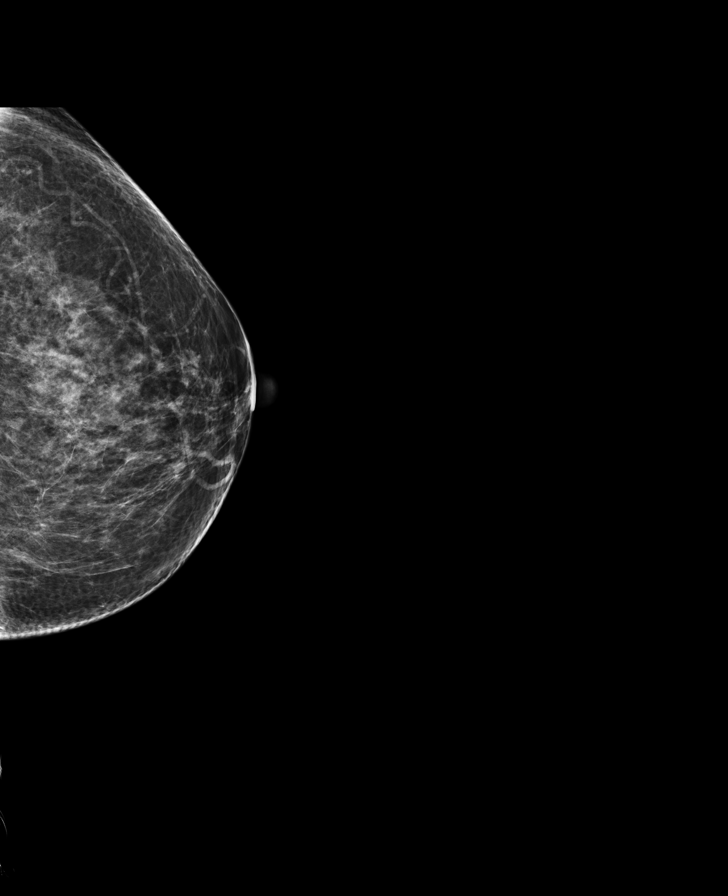

[R CC]
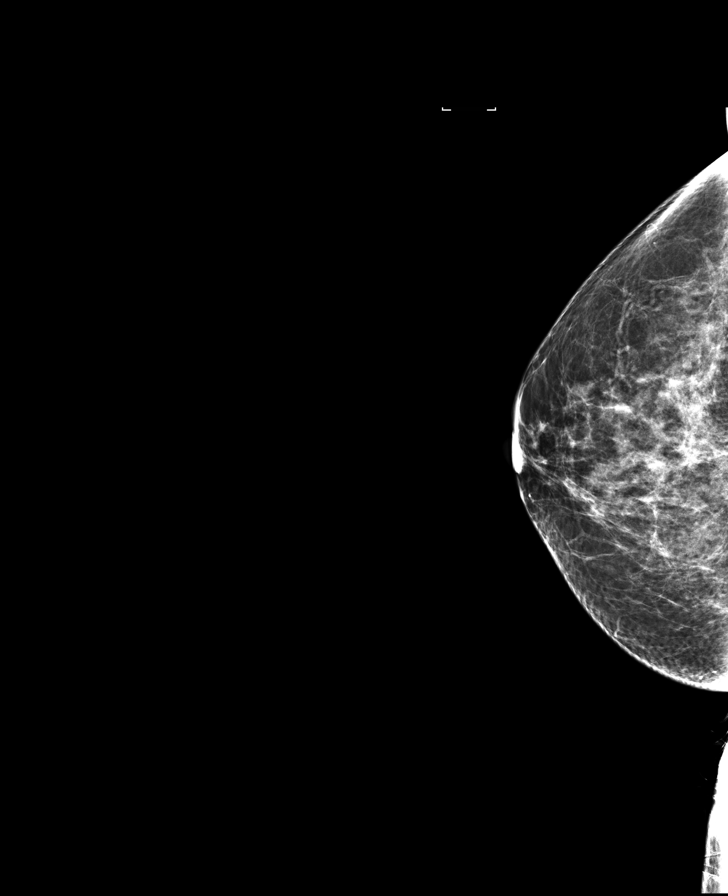

[L CC tomo · 2 of 79 frames shown]
[frame 26/79]
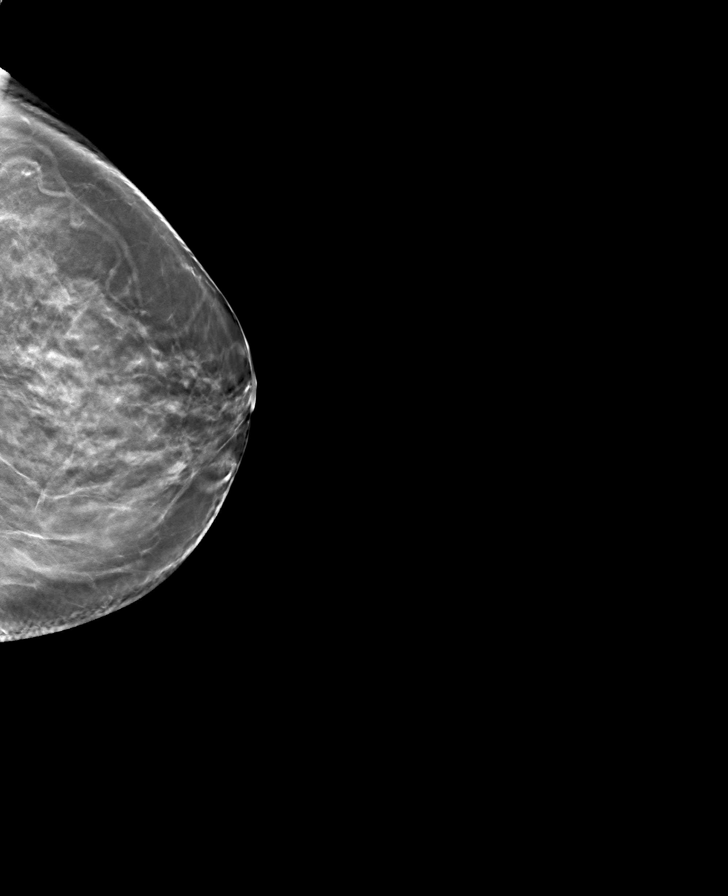
[frame 40/79]
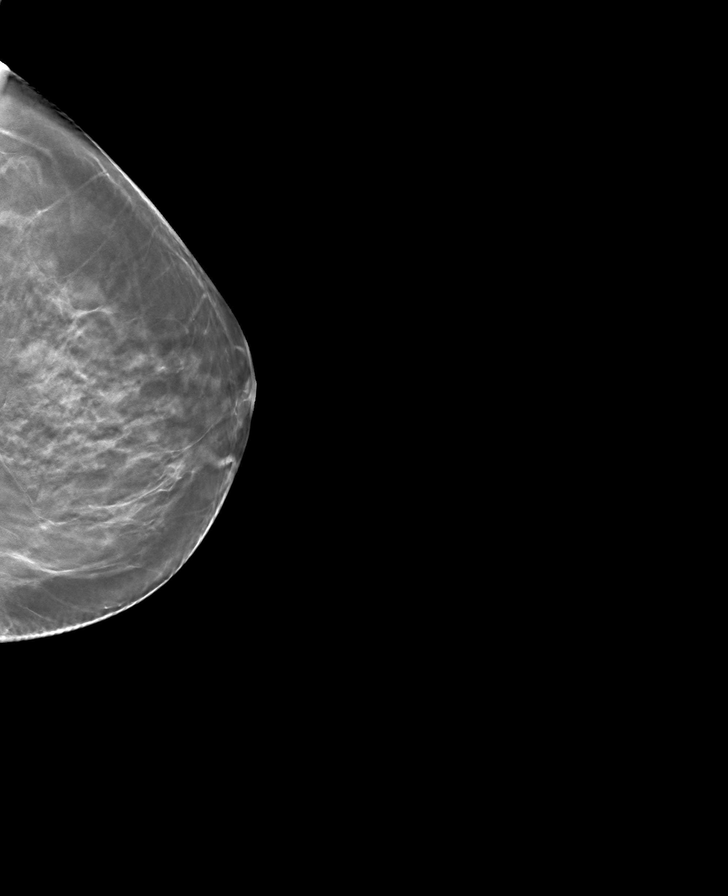

[L MLO tomo · tomo slice 42/83.0]
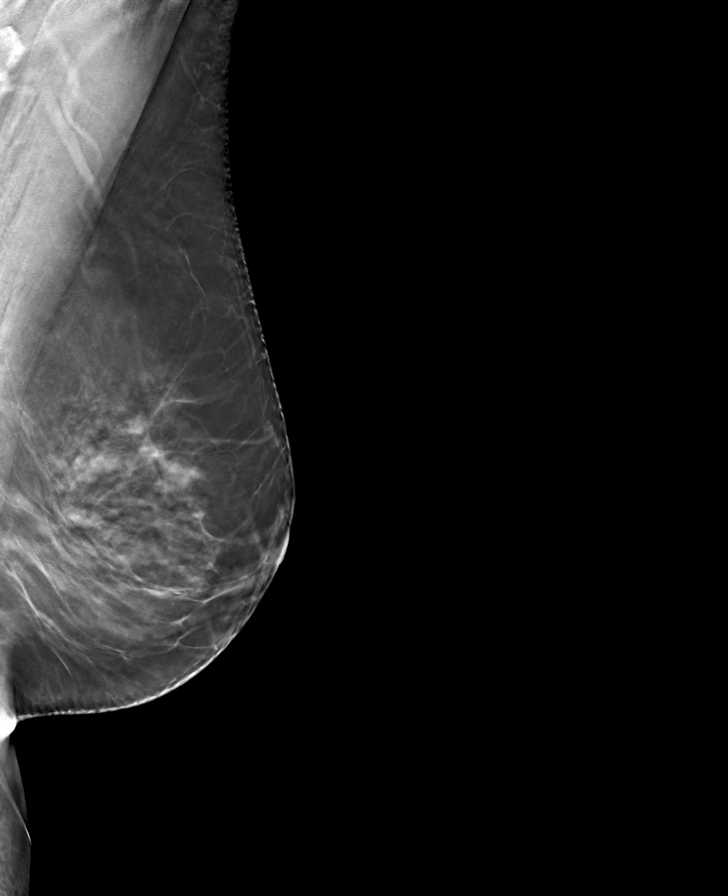

[R CC tomo · tomo slice 38/75.0]
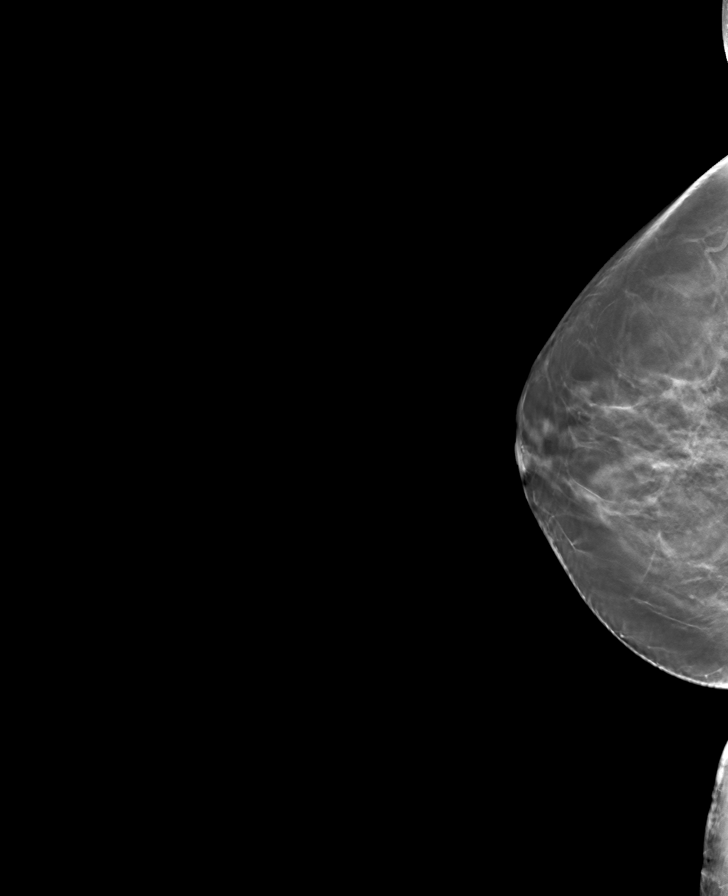

[R MLO tomo · tomo slice 37/73.0]
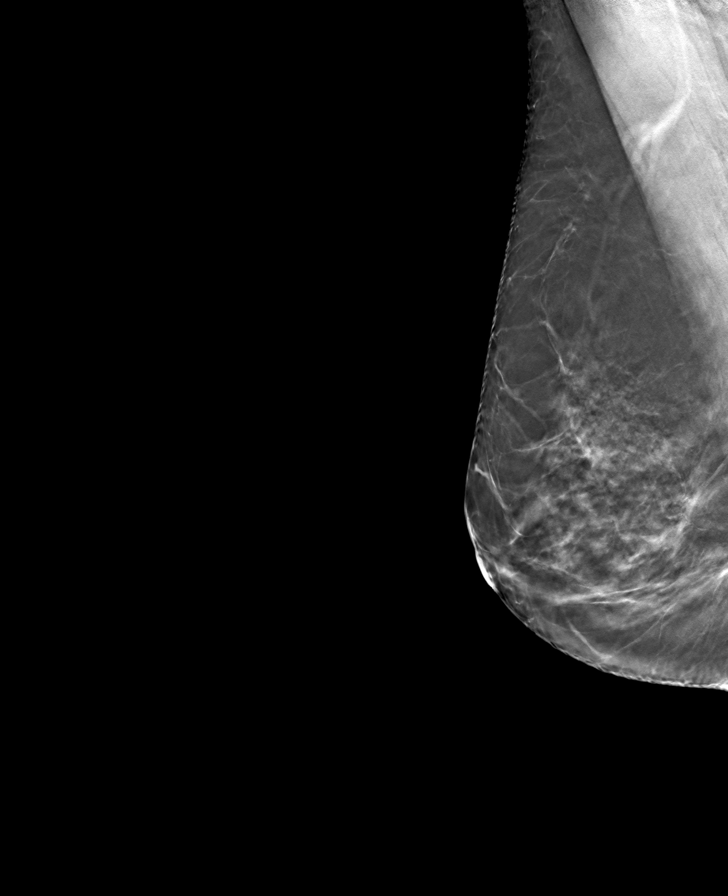

[8 of 23 positions shown; findings below may reference images not displayed]

ACR Breast Density Category c: The breast tissue is heterogeneously
dense, which may obscure small masses.
FINDINGS: There are no findings suspicious for malignancy. Images were
processed with CAD.
IMPRESSION: No mammographic evidence of malignancy. A result letter of this
screening mammogram will be mailed directly to the patient.

RECOMMENDATION:
Screening mammogram in one year. (Code:FT-[id number])

BI-RADS CATEGORY  1: Negative.

## 2019-02-17 ENCOUNTER — Other Ambulatory Visit (HOSPITAL_COMMUNITY): Payer: Self-pay | Admitting: Obstetrics & Gynecology

## 2019-02-17 DIAGNOSIS — Z1231 Encounter for screening mammogram for malignant neoplasm of breast: Secondary | ICD-10-CM

## 2019-10-27 ENCOUNTER — Telehealth: Payer: Self-pay | Admitting: Obstetrics & Gynecology

## 2019-10-27 NOTE — Telephone Encounter (Signed)

## 2019-10-31 ENCOUNTER — Other Ambulatory Visit (HOSPITAL_COMMUNITY)
Admission: RE | Admit: 2019-10-31 | Discharge: 2019-10-31 | Disposition: A | Discharge status: Home / Self Care | Payer: Medicare Other | Source: Ambulatory Visit | Attending: Obstetrics & Gynecology | Admitting: Obstetrics & Gynecology

## 2019-10-31 ENCOUNTER — Encounter: Payer: Self-pay | Admitting: Obstetrics & Gynecology

## 2019-10-31 ENCOUNTER — Other Ambulatory Visit: Payer: Self-pay

## 2019-10-31 ENCOUNTER — Ambulatory Visit (INDEPENDENT_AMBULATORY_CARE_PROVIDER_SITE_OTHER): Payer: Medicare Other | Admitting: Obstetrics & Gynecology

## 2019-10-31 VITALS — BP 137/85 | HR 67 | Ht 60.0 in | Wt 149.0 lb

## 2019-10-31 DIAGNOSIS — Z1151 Encounter for screening for human papillomavirus (HPV): Secondary | ICD-10-CM | POA: Diagnosis not present

## 2019-10-31 DIAGNOSIS — Z01419 Encounter for gynecological examination (general) (routine) without abnormal findings: Secondary | ICD-10-CM | POA: Insufficient documentation

## 2019-10-31 DIAGNOSIS — Z9851 Tubal ligation status: Secondary | ICD-10-CM | POA: Diagnosis not present

## 2019-10-31 NOTE — Progress Notes (Signed)
Subjective:     Ruth Rose is a 54 y.o. female here for a routine exam.  No LMP recorded. Patient has had an ablation. [id number] Birth Control Method:  BTL Menstrual Calendar(currently): amenorrheic  Current complaints: none.   Current acute medical issues:  none   Recent Gynecologic History No LMP recorded. Patient has had an ablation. Last Pap: 2019,  normal Last mammogram: 2019,  normal  Past Medical History:  Diagnosis Date  . Acid reflux   . Anxiety   . Chronic back pain   . Depression   . Fibromyalgia   . Hypertension   . Restless leg syndrome     Past Surgical History:  Procedure Laterality Date  . ablasion    . TUBAL LIGATION      OB History    Gravida  9   Para  6   Term  6   Preterm      AB  3   Living        SAB  3   TAB      Ectopic      Multiple      Live Births              Social History   Socioeconomic History  . Marital status: Divorced    Spouse name: Not on file  . Number of children: Not on file  . Years of education: Not on file  . Highest education level: Not on file  Occupational History  . Not on file  Tobacco Use  . Smoking status: Never Smoker  . Smokeless tobacco: Never Used  Substance and Sexual Activity  . Alcohol use: No  . Drug use: No  . Sexual activity: Yes    Birth control/protection: Surgical    Comment: tubal and ablation  Other Topics Concern  . Not on file  Social History Narrative  . Not on file   Social Determinants of Health   Financial Resource Strain:   . Difficulty of Paying Living Expenses:   Food Insecurity:   . Worried About Charity fundraiser in the Last Year:   . Arboriculturist in the Last Year:   Transportation Needs:   . Film/video editor (Medical):   Marland Kitchen Lack of Transportation (Non-Medical):   Physical Activity:   . Days of Exercise per Week:   . Minutes of Exercise per Session:   Stress:   . Feeling of Stress :   Social Connections:   . Frequency of  Communication with Friends and Family:   . Frequency of Social Gatherings with Friends and Family:   . Attends Religious Services:   . Active Member of Clubs or Organizations:   . Attends Archivist Meetings:   Marland Kitchen Marital Status:     Family History  Problem Relation Age of Onset  . Cancer Mother   . Cancer Father   . Seizures Father   . Cancer Other   . Heart failure Other   . Other Paternal Grandfather        MVA  . Congestive Heart Failure Paternal Grandmother   . Other Maternal Grandmother        brain tumor  . Congestive Heart Failure Maternal Grandfather      Current Outpatient Medications:  .  ALPRAZolam (XANAX) 1 MG tablet, Take 1 mg by mouth as needed. , Disp: , Rfl:  .  amitriptyline (ELAVIL) 25 MG tablet, 25 MG DAILY CAN TAKE UP TO  300 MG DAILY, Disp: , Rfl:  .  cetirizine (ZYRTEC) 10 MG tablet, Take 10 mg by mouth daily., Disp: , Rfl:  .  Vitamin D, Ergocalciferol, (DRISDOL) 1.25 MG (50000 UNIT) CAPS capsule, Take 50,000 Units by mouth every 7 (seven) days., Disp: , Rfl:   Review of Systems  Review of Systems  Constitutional: Negative for fever, chills, weight loss, malaise/fatigue and diaphoresis.  HENT: Negative for hearing loss, ear pain, nosebleeds, congestion, sore throat, neck pain, tinnitus and ear discharge.   Eyes: Negative for blurred vision, double vision, photophobia, pain, discharge and redness.  Respiratory: Negative for cough, hemoptysis, sputum production, shortness of breath, wheezing and stridor.   Cardiovascular: Negative for chest pain, palpitations, orthopnea, claudication, leg swelling and PND.  Gastrointestinal: negative for abdominal pain. Negative for heartburn, nausea, vomiting, diarrhea, constipation, blood in stool and melena.  Genitourinary: Negative for dysuria, urgency, frequency, hematuria and flank pain.  Musculoskeletal: Negative for myalgias, back pain, joint pain and falls.  Skin: Negative for itching and rash.   Neurological: Negative for dizziness, tingling, tremors, sensory change, speech change, focal weakness, seizures, loss of consciousness, weakness and headaches.  Endo/Heme/Allergies: Negative for environmental allergies and polydipsia. Does not bruise/bleed easily.  Psychiatric/Behavioral: Negative for depression, suicidal ideas, hallucinations, memory loss and substance abuse. The patient is not nervous/anxious and does not have insomnia.        Objective:  Blood pressure 137/85, pulse 67, height 5' (1.524 m), weight 149 lb (67.6 kg).   Physical Exam  Vitals reviewed. Constitutional: She is oriented to person, place, and time. She appears well-developed and well-nourished.  HENT:  Head: Normocephalic and atraumatic.        Right Ear: External ear normal.  Left Ear: External ear normal.  Nose: Nose normal.  Mouth/Throat: Oropharynx is clear and moist.  Eyes: Conjunctivae and EOM are normal. Pupils are equal, round, and reactive to light. Right eye exhibits no discharge. Left eye exhibits no discharge. No scleral icterus.  Neck: Normal range of motion. Neck supple. No tracheal deviation present. No thyromegaly present.  Cardiovascular: Normal rate, regular rhythm, normal heart sounds and intact distal pulses.  Exam reveals no gallop and no friction rub.   No murmur heard. Respiratory: Effort normal and breath sounds normal. No respiratory distress. She has no wheezes. She has no rales. She exhibits no tenderness.  GI: Soft. Bowel sounds are normal. She exhibits no distension and no mass. There is no tenderness. There is no rebound and no guarding.  Genitourinary:  Breasts no masses skin changes or nipple changes bilaterally      Vulva is normal without lesions Vagina is pink moist without discharge Cervix normal in appearance and pap is done Uterus is normal size shape and contour Adnexa is negative with normal sized ovaries  {Rectal    hemoccult negative, normal tone, no masses   Musculoskeletal: Normal range of motion. She exhibits no edema and no tenderness.  Neurological: She is alert and oriented to person, place, and time. She has normal reflexes. She displays normal reflexes. No cranial nerve deficit. She exhibits normal muscle tone. Coordination normal.  Skin: Skin is warm and dry. No rash noted. No erythema. No pallor.  Psychiatric: She has a normal mood and affect. Her behavior is normal. Judgment and thought content normal.       Medications Ordered at today's visit: No orders of the defined types were placed in this encounter.   Other orders placed at today's visit: No orders of the  defined types were placed in this encounter.     Assessment:     Normal Gyn exam.    Plan:    Contraception: tubal ligation. Mammogram ordered. Follow up in: 3 years.     Return in about 3 years (around 10/31/2022) for yearly.

## 2019-11-01 LAB — CYTOLOGY - PAP
Comment: NEGATIVE
Diagnosis: NEGATIVE
High risk HPV: NEGATIVE

## 2020-04-16 ENCOUNTER — Other Ambulatory Visit: Payer: Self-pay | Admitting: Obstetrics & Gynecology

## 2020-04-16 DIAGNOSIS — Z1231 Encounter for screening mammogram for malignant neoplasm of breast: Secondary | ICD-10-CM

## 2022-12-12 ENCOUNTER — Ambulatory Visit: Payer: Medicare HMO | Admitting: Obstetrics & Gynecology

## 2022-12-12 ENCOUNTER — Other Ambulatory Visit (HOSPITAL_COMMUNITY)
Admission: RE | Admit: 2022-12-12 | Discharge: 2022-12-12 | Disposition: A | Discharge status: Home / Self Care | Payer: Medicare HMO | Source: Ambulatory Visit | Attending: Obstetrics & Gynecology | Admitting: Obstetrics & Gynecology

## 2022-12-12 ENCOUNTER — Encounter: Payer: Self-pay | Admitting: Obstetrics & Gynecology

## 2022-12-12 VITALS — BP 158/91 | HR 68 | Ht 62.0 in | Wt 155.6 lb

## 2022-12-12 DIAGNOSIS — Z1211 Encounter for screening for malignant neoplasm of colon: Secondary | ICD-10-CM

## 2022-12-12 DIAGNOSIS — Z1151 Encounter for screening for human papillomavirus (HPV): Secondary | ICD-10-CM | POA: Diagnosis not present

## 2022-12-12 DIAGNOSIS — Z01419 Encounter for gynecological examination (general) (routine) without abnormal findings: Secondary | ICD-10-CM | POA: Insufficient documentation

## 2022-12-12 DIAGNOSIS — Z1212 Encounter for screening for malignant neoplasm of rectum: Secondary | ICD-10-CM

## 2022-12-12 LAB — HEMOCCULT GUIAC POC 1CARD (OFFICE): Fecal Occult Blood, POC: NEGATIVE

## 2022-12-12 NOTE — Progress Notes (Unsigned)
Subjective:     Ruth Rose is a 57 y.o. female here for a routine exam.  No LMP recorded. Patient has had an ablation. [id number] Birth Control Method:  menopausal Menstrual Calendar(currently): amenorrhea  Current complaints: none.   Current acute medical issues:  none   Recent Gynecologic History No LMP recorded. Patient has had an ablation. Last Pap: 10/2019,  normal Last mammogram: 01/01/2023 [facility],  normal  Past Medical History:  Diagnosis Date   Acid reflux    Anxiety    Chronic back pain    Depression    Fibromyalgia    Hypertension    Restless leg syndrome     Past Surgical History:  Procedure Laterality Date   ablasion     TUBAL LIGATION      OB History     Gravida  9   Para  6   Term  6   Preterm      AB  3   Living         SAB  3   IAB      Ectopic      Multiple      Live Births              Social History   Socioeconomic History   Marital status: Divorced    Spouse name: Not on file   Number of children: Not on file   Years of education: Not on file   Highest education level: Not on file  Occupational History   Not on file  Tobacco Use   Smoking status: Never   Smokeless tobacco: Never  Vaping Use   Vaping Use: Never used  Substance and Sexual Activity   Alcohol use: No   Drug use: No   Sexual activity: Yes    Birth control/protection: Surgical    Comment: tubal and ablation  Other Topics Concern   Not on file  Social History Narrative   Not on file   Social Determinants of Health   Financial Resource Strain: Patient Declined (12/12/2022)   Overall Financial Resource Strain (CARDIA)    Difficulty of Paying Living Expenses: Patient declined  Food Insecurity: Food Insecurity Present (12/12/2022)   Hunger Vital Sign    Worried About Running Out of Food in the Last Year: Never true    Ran Out of Food in the Last Year: Sometimes true  Transportation Needs: No Transportation Needs (12/12/2022)   PRAPARE -  Administrator, Civil Service (Medical): No    Lack of Transportation (Non-Medical): No  Physical Activity: Sufficiently Active (12/12/2022)   Exercise Vital Sign    Days of Exercise per Week: 7 days    Minutes of Exercise per Session: 30 min  Stress: No Stress Concern Present (12/12/2022)   [employer] of Occupational Health - Occupational Stress Questionnaire    Feeling of Stress : Only a little  Social Connections: Socially Isolated (12/12/2022)   Social Connection and Isolation Panel [NHANES]    Frequency of Communication with Friends and Family: Once a week    Frequency of Social Gatherings with Friends and Family: Once a week    Attends Religious Services: More than 4 times per year    Active Member of [employer] or Organizations: No    Attends Banker Meetings: Patient declined    Marital Status: Divorced    Family History  Problem Relation Age of Onset   Cancer Mother    Cancer Father  Seizures Father    Cancer Other    Heart failure Other    Other Paternal Grandfather        MVA   Congestive Heart Failure Paternal Grandmother    Other Maternal Grandmother        brain tumor   Congestive Heart Failure Maternal Grandfather     No current outpatient medications on file.  Review of Systems  Review of Systems  Constitutional: Negative for fever, chills, weight loss, malaise/fatigue and diaphoresis.  HENT: Negative for hearing loss, ear pain, nosebleeds, congestion, sore throat, neck pain, tinnitus and ear discharge.   Eyes: Negative for blurred vision, double vision, photophobia, pain, discharge and redness.  Respiratory: Negative for cough, hemoptysis, sputum production, shortness of breath, wheezing and stridor.   Cardiovascular: Negative for chest pain, palpitations, orthopnea, claudication, leg swelling and PND.  Gastrointestinal: negative for abdominal pain. Negative for heartburn, nausea, vomiting, diarrhea, constipation, blood in stool  and melena.  Genitourinary: Negative for dysuria, urgency, frequency, hematuria and flank pain.  Musculoskeletal: Negative for myalgias, back pain, joint pain and falls.  Skin: Negative for itching and rash.  Neurological: Negative for dizziness, tingling, tremors, sensory change, speech change, focal weakness, seizures, loss of consciousness, weakness and headaches.  Endo/Heme/Allergies: Negative for environmental allergies and polydipsia. Does not bruise/bleed easily.  Psychiatric/Behavioral: Negative for depression, suicidal ideas, hallucinations, memory loss and substance abuse. The patient is not nervous/anxious and does not have insomnia.        Objective:  Blood pressure (!) 158/91, pulse 68, height 5\' 2"  (1.575 m), weight 155 lb 9.6 oz (70.6 kg).   Physical Exam  Vitals reviewed. Constitutional: She is oriented to person, place, and time. She appears well-developed and well-nourished.  HENT:  Head: Normocephalic and atraumatic.        Right Ear: External ear normal.  Left Ear: External ear normal.  Nose: Nose normal.  Mouth/Throat: Oropharynx is clear and moist.  Eyes: Conjunctivae and EOM are normal. Pupils are equal, round, and reactive to light. Right eye exhibits no discharge. Left eye exhibits no discharge. No scleral icterus.  Neck: Normal range of motion. Neck supple. No tracheal deviation present. No thyromegaly present.  Cardiovascular: Normal rate, regular rhythm, normal heart sounds and intact distal pulses.  Exam reveals no gallop and no friction rub.   No murmur heard. Respiratory: Effort normal and breath sounds normal. No respiratory distress. She has no wheezes. She has no rales. She exhibits no tenderness.  GI: Soft. Bowel sounds are normal. She exhibits no distension and no mass. There is no tenderness. There is no rebound and no guarding.  Genitourinary:  Breasts no masses skin changes or nipple changes bilaterally      Vulva is normal without lesions Vagina  is pink moist without discharge Cervix normal in appearance and pap is done Uterus is normal size shape and contour Adnexa is negative with normal sized ovaries  {Rectal    hemoccult negative, normal tone, no masses  Musculoskeletal: Normal range of motion. She exhibits no edema and no tenderness.  Neurological: She is alert and oriented to person, place, and time. She has normal reflexes. She displays normal reflexes. No cranial nerve deficit. She exhibits normal muscle tone. Coordination normal.  Skin: Skin is warm and dry. No rash noted. No erythema. No pallor.  Psychiatric: She has a normal mood and affect. Her behavior is normal. Judgment and thought content normal.       Medications Ordered at today's visit: No orders  of the defined types were placed in this encounter.   Other orders placed at today's visit: No orders of the defined types were placed in this encounter.     Assessment:    Normal Gyn exam.   Recent loss of a number of relatives Plan:    Follow up in: 3 years. Needs mammogram after 01/03/2023      Return in about 3 years (around 12/11/2025) for yearly.

## 2022-12-15 LAB — CYTOLOGY - PAP
Comment: NEGATIVE
Diagnosis: NEGATIVE
High risk HPV: NEGATIVE

## 2024-03-17 ENCOUNTER — Ambulatory Visit: Admitting: Obstetrics & Gynecology

## 2024-05-11 ENCOUNTER — Encounter: Payer: Self-pay | Admitting: Obstetrics & Gynecology

## 2024-05-11 ENCOUNTER — Ambulatory Visit (INDEPENDENT_AMBULATORY_CARE_PROVIDER_SITE_OTHER): Admitting: Obstetrics & Gynecology

## 2024-05-11 VITALS — BP 177/84 | HR 66 | Ht 62.0 in | Wt 151.0 lb

## 2024-05-11 DIAGNOSIS — I10 Essential (primary) hypertension: Secondary | ICD-10-CM

## 2024-05-11 DIAGNOSIS — Z1231 Encounter for screening mammogram for malignant neoplasm of breast: Secondary | ICD-10-CM

## 2024-05-11 DIAGNOSIS — Z01419 Encounter for gynecological examination (general) (routine) without abnormal findings: Secondary | ICD-10-CM | POA: Diagnosis not present

## 2024-05-11 NOTE — Progress Notes (Signed)
 Subjective:     Ruth Rose is a 58 y.o. female here for a routine exam.  No LMP recorded. Patient has had an ablation. [id number] Birth Control Method:  menopausal Menstrual Calendar(currently): amenorrhea  Current complaints: back pain chronic and fibromyalgia.   Current acute medical issues:  back pain -->patient states causes her BP to be elevated   Recent Gynecologic History No LMP recorded. Patient has had an ablation. Last Pap: 12/2022,  normal Last mammogram: 12/2021,  normal  Past Medical History:  Diagnosis Date   Acid reflux    Anxiety    Chronic back pain    Depression    Fibromyalgia    Hypertension    Restless leg syndrome     Past Surgical History:  Procedure Laterality Date   ablasion     TUBAL LIGATION      OB History     Gravida  9   Para  6   Term  6   Preterm      AB  3   Living         SAB  3   IAB      Ectopic      Multiple      Live Births              Social History   Socioeconomic History   Marital status: Divorced    Spouse name: Not on file   Number of children: Not on file   Years of education: Not on file   Highest education level: Not on file  Occupational History   Not on file  Tobacco Use   Smoking status: Never   Smokeless tobacco: Never  Vaping Use   Vaping status: Never Used  Substance and Sexual Activity   Alcohol use: No   Drug use: No   Sexual activity: Yes    Birth control/protection: Surgical    Comment: tubal and ablation  Other Topics Concern   Not on file  Social History Narrative   Not on file   Social Drivers of Health   Financial Resource Strain: Medium Risk (05/11/2024)   Overall Financial Resource Strain (CARDIA)    Difficulty of Paying Living Expenses: Somewhat hard  Food Insecurity: Food Insecurity Present (05/11/2024)   Hunger Vital Sign    Worried About Running Out of Food in the Last Year: Sometimes true    Ran Out of Food in the Last Year: Sometimes true  Transportation  Needs: No Transportation Needs (05/11/2024)   PRAPARE - Administrator, Civil Service (Medical): No    Lack of Transportation (Non-Medical): No  Physical Activity: Sufficiently Active (05/11/2024)   Exercise Vital Sign    Days of Exercise per Week: 7 days    Minutes of Exercise per Session: 50 min  Stress: No Stress Concern Present (05/11/2024)   [employer] of Occupational Health - Occupational Stress Questionnaire    Feeling of Stress: Not at all  Social Connections: Moderately Isolated (05/11/2024)   Social Connection and Isolation Panel    Frequency of Communication with Friends and Family: Twice a week    Frequency of Social Gatherings with Friends and Family: Once a week    Attends Religious Services: 1 to 4 times per year    Active Member of [employer] or Organizations: No    Attends Banker Meetings: Never    Marital Status: Divorced    Family History  Problem Relation Age of Onset   Cancer  Mother    Cancer Father    Seizures Father    Cancer Other    Heart failure Other    Other Paternal Grandfather        MVA   Congestive Heart Failure Paternal Grandmother    Other Maternal Grandmother        brain tumor   Congestive Heart Failure Maternal Grandfather     No current outpatient medications on file.  Review of Systems  Review of Systems  Constitutional: Negative for fever, chills, weight loss, malaise/fatigue and diaphoresis.  HENT: Negative for hearing loss, ear pain, nosebleeds, congestion, sore throat, neck pain, tinnitus and ear discharge.   Eyes: Negative for blurred vision, double vision, photophobia, pain, discharge and redness.  Respiratory: Negative for cough, hemoptysis, sputum production, shortness of breath, wheezing and stridor.   Cardiovascular: Negative for chest pain, palpitations, orthopnea, claudication, leg swelling and PND.  Gastrointestinal: negative for abdominal pain. Negative for heartburn, nausea, vomiting, diarrhea,  constipation, blood in stool and melena.  Genitourinary: Negative for dysuria, urgency, frequency, hematuria and flank pain.  Musculoskeletal: Negative for myalgias, back pain, joint pain and falls.  Skin: Negative for itching and rash.  Neurological: Negative for dizziness, tingling, tremors, sensory change, speech change, focal weakness, seizures, loss of consciousness, weakness and headaches.  Endo/Heme/Allergies: Negative for environmental allergies and polydipsia. Does not bruise/bleed easily.  Psychiatric/Behavioral: Negative for depression, suicidal ideas, hallucinations, memory loss and substance abuse. The patient is not nervous/anxious and does not have insomnia.        Objective:  Blood pressure (!) 177/84, pulse 66, height 5' 2 (1.575 m), weight 151 lb (68.5 kg).   Physical Exam  Vitals reviewed. Constitutional: She is oriented to person, place, and time. She appears well-developed and well-nourished.  HENT:  Head: Normocephalic and atraumatic.        Right Ear: External ear normal.  Left Ear: External ear normal.  Nose: Nose normal.  Mouth/Throat: Oropharynx is clear and moist.  Eyes: Conjunctivae and EOM are normal. Pupils are equal, round, and reactive to light. Right eye exhibits no discharge. Left eye exhibits no discharge. No scleral icterus.  Neck: Normal range of motion. Neck supple. No tracheal deviation present. No thyromegaly present.  Cardiovascular: Normal rate, regular rhythm, normal heart sounds and intact distal pulses.  Exam reveals no gallop and no friction rub.   No murmur heard. Respiratory: Effort normal and breath sounds normal. No respiratory distress. She has no wheezes. She has no rales. She exhibits no tenderness.  GI: Soft. Bowel sounds are normal. She exhibits no distension and no mass. There is no tenderness. There is no rebound and no guarding.  Genitourinary:  Breasts no masses skin changes or nipple changes bilaterally      Vulva is normal  without lesions Vagina is pink moist without discharge Cervix normal in appearance and pap is not done Uterus is normal size shape and contour Adnexa is negative with normal sized ovaries   Musculoskeletal: Normal range of motion. She exhibits no edema and no tenderness.  Neurological: She is alert and oriented to person, place, and time. She has normal reflexes. She displays normal reflexes. No cranial nerve deficit. She exhibits normal muscle tone. Coordination normal.  Skin: Skin is warm and dry. No rash noted. No erythema. No pallor.  Psychiatric: She has a normal mood and affect. Her behavior is normal. Judgment and thought content normal.       Medications Ordered at today's visit: No orders of the  defined types were placed in this encounter.   Other orders placed at today's visit: Orders Placed This Encounter  Procedures   MM 3D SCREENING MAMMOGRAM BILATERAL BREAST     ASSESSMENT + PLAN:    ICD-10-CM   1. Well woman exam with routine gynecological exam  Z01.419     2. Breast cancer screening by mammogram  Z12.31 MM 3D SCREENING MAMMOGRAM BILATERAL BREAST    MM 3D SCREENING MAMMOGRAM BILATERAL BREAST    3. Essential hypertension: recommend routine evaluation and manage her chronic pain issues  I10           Return in about 2 years (around 05/11/2026) for well woman gyn exam.

## 2024-05-19 ENCOUNTER — Ambulatory Visit (HOSPITAL_COMMUNITY)
Admission: RE | Admit: 2024-05-19 | Discharge: 2024-05-19 | Disposition: A | Discharge status: Home / Self Care | Source: Ambulatory Visit | Attending: Obstetrics & Gynecology | Admitting: Obstetrics & Gynecology

## 2024-05-19 ENCOUNTER — Inpatient Hospital Stay
Admission: RE | Admit: 2024-05-19 | Discharge: 2024-05-19 | Disposition: A | Discharge status: Home / Self Care | Payer: Self-pay | Source: Ambulatory Visit | Attending: Obstetrics & Gynecology | Admitting: Obstetrics & Gynecology

## 2024-05-19 ENCOUNTER — Other Ambulatory Visit: Payer: Self-pay | Admitting: Obstetrics & Gynecology

## 2024-05-19 DIAGNOSIS — Z1231 Encounter for screening mammogram for malignant neoplasm of breast: Secondary | ICD-10-CM

## 2024-05-25 ENCOUNTER — Ambulatory Visit (HOSPITAL_COMMUNITY): Payer: Self-pay | Admitting: Obstetrics & Gynecology
